# Patient Record
Sex: Female | Born: 1970
Health system: Southern US, Community
[De-identification: ages and names within clinical notes are randomized; demographics above are authoritative.]

## PROBLEM LIST (undated history)

## (undated) DIAGNOSIS — D649 Anemia, unspecified: Secondary | ICD-10-CM

## (undated) DIAGNOSIS — E079 Disorder of thyroid, unspecified: Secondary | ICD-10-CM

## (undated) DIAGNOSIS — IMO0001 Reserved for inherently not codable concepts without codable children: Secondary | ICD-10-CM

## (undated) DIAGNOSIS — E559 Vitamin D deficiency, unspecified: Secondary | ICD-10-CM

## (undated) DIAGNOSIS — B029 Zoster without complications: Secondary | ICD-10-CM

## (undated) DIAGNOSIS — T7840XA Allergy, unspecified, initial encounter: Secondary | ICD-10-CM

## (undated) HISTORY — DX: Reserved for inherently not codable concepts without codable children: IMO0001

## (undated) HISTORY — DX: Zoster without complications: B02.9

## (undated) HISTORY — DX: Allergy, unspecified, initial encounter: T78.40XA

## (undated) HISTORY — DX: Disorder of thyroid, unspecified: E07.9

## (undated) HISTORY — DX: Vitamin D deficiency, unspecified: E55.9

## (undated) HISTORY — DX: Anemia, unspecified: D64.9

---

## 2008-01-09 ENCOUNTER — Other Ambulatory Visit: Admission: RE | Admit: 2008-01-09 | Discharge: 2008-01-09 | Payer: Self-pay | Admitting: Internal Medicine

## 2008-01-09 ENCOUNTER — Ambulatory Visit: Payer: Self-pay | Admitting: Internal Medicine

## 2008-01-09 ENCOUNTER — Encounter: Payer: Self-pay | Admitting: Internal Medicine

## 2008-01-09 DIAGNOSIS — M549 Dorsalgia, unspecified: Secondary | ICD-10-CM | POA: Insufficient documentation

## 2008-01-13 LAB — CONVERTED CEMR LAB
ALT: 21 units/L (ref 0–35)
AST: 16 units/L (ref 0–37)
Albumin: 3.8 g/dL (ref 3.5–5.2)
Alkaline Phosphatase: 48 units/L (ref 39–117)
Basophils Relative: 3.2 % — ABNORMAL HIGH (ref 0.0–1.0)
Bilirubin, Direct: 0.1 mg/dL (ref 0.0–0.3)
CO2: 30 meq/L (ref 19–32)
Calcium: 9.2 mg/dL (ref 8.4–10.5)
Cholesterol: 142 mg/dL (ref 0–200)
GFR calc Af Amer: 122 mL/min
GFR calc non Af Amer: 101 mL/min
Lymphocytes Relative: 65.2 % — ABNORMAL HIGH (ref 12.0–46.0)
MCHC: 32.3 g/dL (ref 30.0–36.0)
Neutrophils Relative %: 19.9 % — ABNORMAL LOW (ref 43.0–77.0)
RBC: 4.58 M/uL (ref 3.87–5.11)
TSH: 3.5 microintl units/mL (ref 0.35–5.50)
Total Bilirubin: 0.4 mg/dL (ref 0.3–1.2)
Total Protein: 6.9 g/dL (ref 6.0–8.3)

## 2008-01-26 ENCOUNTER — Encounter: Admission: RE | Admit: 2008-01-26 | Discharge: 2008-01-26 | Payer: Self-pay | Admitting: Internal Medicine

## 2008-02-02 ENCOUNTER — Encounter (INDEPENDENT_AMBULATORY_CARE_PROVIDER_SITE_OTHER): Payer: Self-pay | Admitting: *Deleted

## 2008-02-15 ENCOUNTER — Encounter (INDEPENDENT_AMBULATORY_CARE_PROVIDER_SITE_OTHER): Payer: Self-pay | Admitting: *Deleted

## 2008-02-29 ENCOUNTER — Ambulatory Visit: Payer: Self-pay | Admitting: Internal Medicine

## 2008-02-29 DIAGNOSIS — D72819 Decreased white blood cell count, unspecified: Secondary | ICD-10-CM | POA: Insufficient documentation

## 2008-03-02 LAB — CONVERTED CEMR LAB
Basophils Relative: 4.3 % — ABNORMAL HIGH (ref 0.0–1.0)
Lymphocytes Relative: 55.7 % — ABNORMAL HIGH (ref 12.0–46.0)
MCV: 87.5 fL (ref 78.0–100.0)
Monocytes Absolute: 0.4 10*3/uL (ref 0.2–0.7)
Neutrophils Relative %: 20.4 % — ABNORMAL LOW (ref 43.0–77.0)
RBC: 4.45 M/uL (ref 3.87–5.11)
RDW: 12.6 % (ref 11.5–14.6)
WBC: 3.1 10*3/uL — ABNORMAL LOW (ref 4.5–10.5)

## 2008-06-20 ENCOUNTER — Encounter: Payer: Self-pay | Admitting: Internal Medicine

## 2008-08-28 ENCOUNTER — Telehealth (INDEPENDENT_AMBULATORY_CARE_PROVIDER_SITE_OTHER): Payer: Self-pay | Admitting: *Deleted

## 2008-08-30 ENCOUNTER — Encounter (INDEPENDENT_AMBULATORY_CARE_PROVIDER_SITE_OTHER): Payer: Self-pay | Admitting: *Deleted

## 2008-09-12 ENCOUNTER — Ambulatory Visit: Payer: Self-pay | Admitting: Internal Medicine

## 2008-09-14 LAB — CONVERTED CEMR LAB
Basophils Absolute: 0.1 10*3/uL (ref 0.0–0.1)
Eosinophils Absolute: 0.2 10*3/uL (ref 0.0–0.7)
HCT: 35.6 % — ABNORMAL LOW (ref 36.0–46.0)
Hemoglobin: 12.3 g/dL (ref 12.0–15.0)
Neutro Abs: 4.8 10*3/uL (ref 1.4–7.7)
Neutrophils Relative %: 68.1 % (ref 43.0–77.0)
RBC: 4.09 M/uL (ref 3.87–5.11)

## 2009-02-22 ENCOUNTER — Encounter: Admission: RE | Admit: 2009-02-22 | Discharge: 2009-02-22 | Payer: Self-pay | Admitting: Internal Medicine

## 2009-03-01 ENCOUNTER — Encounter (INDEPENDENT_AMBULATORY_CARE_PROVIDER_SITE_OTHER): Payer: Self-pay | Admitting: *Deleted

## 2009-03-22 ENCOUNTER — Encounter: Payer: Self-pay | Admitting: Internal Medicine

## 2009-03-22 ENCOUNTER — Ambulatory Visit: Payer: Self-pay | Admitting: Internal Medicine

## 2009-03-22 ENCOUNTER — Other Ambulatory Visit: Admission: RE | Admit: 2009-03-22 | Discharge: 2009-03-22 | Payer: Self-pay | Admitting: Internal Medicine

## 2009-03-22 DIAGNOSIS — J309 Allergic rhinitis, unspecified: Secondary | ICD-10-CM | POA: Insufficient documentation

## 2009-03-22 DIAGNOSIS — N926 Irregular menstruation, unspecified: Secondary | ICD-10-CM | POA: Insufficient documentation

## 2009-03-27 LAB — CONVERTED CEMR LAB
HCT: 39.3 % (ref 36.0–46.0)
HDL: 38.8 mg/dL — ABNORMAL LOW (ref >39.00)
Hemoglobin: 13.4 g/dL (ref 12.0–15.0)
Lymphs Abs: 2.3 10*3/uL (ref 0.7–4.0)
MCHC: 34 g/dL (ref 30.0–36.0)
Neutro Abs: 3.3 10*3/uL (ref 1.4–7.7)
Neutrophils Relative %: 52.2 % (ref 43.0–77.0)
Platelets: 231 10*3/uL (ref 150.0–400.0)
Total CHOL/HDL Ratio: 3
Triglycerides: 34 mg/dL (ref 0.0–149.0)
VLDL: 6.8 mg/dL (ref 0.0–40.0)
WBC: 6.2 10*3/uL (ref 4.5–10.5)

## 2009-04-26 ENCOUNTER — Telehealth: Payer: Self-pay | Admitting: Internal Medicine

## 2010-01-08 ENCOUNTER — Emergency Department (HOSPITAL_COMMUNITY): Admission: EM | Admit: 2010-01-08 | Discharge: 2010-01-08 | Payer: Self-pay | Admitting: Family Medicine

## 2010-02-28 ENCOUNTER — Encounter: Admission: RE | Admit: 2010-02-28 | Discharge: 2010-02-28 | Payer: Self-pay | Admitting: Internal Medicine

## 2010-10-23 ENCOUNTER — Telehealth (INDEPENDENT_AMBULATORY_CARE_PROVIDER_SITE_OTHER): Payer: Self-pay | Admitting: *Deleted

## 2011-01-13 NOTE — Progress Notes (Signed)
Summary: Appt  Phone Note Outgoing Call   Call placed by: Army Fossa CMA,  October 23, 2010 1:44 PM Summary of Call: Left message for pt in order to fill out form for [employer] pt needs to have an appt.   Follow-up for Phone Call        left message to call back.Marland KitchenMarland KitchenHarold Barban  October 24, 2010 9:14 AM  Additional Follow-up for Phone Call Additional follow up Details #1::        I spoke with pt she will call back and make an appt.  Additional Follow-up by: Army Fossa CMA,  October 27, 2010 3:22 PM

## 2011-01-23 ENCOUNTER — Other Ambulatory Visit: Payer: Self-pay | Admitting: Internal Medicine

## 2011-01-23 DIAGNOSIS — Z1231 Encounter for screening mammogram for malignant neoplasm of breast: Secondary | ICD-10-CM

## 2011-02-04 ENCOUNTER — Encounter: Payer: Self-pay | Admitting: Internal Medicine

## 2011-03-09 ENCOUNTER — Ambulatory Visit
Admission: RE | Admit: 2011-03-09 | Discharge: 2011-03-09 | Disposition: A | Discharge status: Home / Self Care | Payer: Self-pay | Source: Ambulatory Visit | Attending: Internal Medicine | Admitting: Internal Medicine

## 2011-03-09 ENCOUNTER — Other Ambulatory Visit (HOSPITAL_COMMUNITY)
Admission: RE | Admit: 2011-03-09 | Discharge: 2011-03-09 | Disposition: A | Discharge status: Home / Self Care | Payer: 59 | Source: Ambulatory Visit | Attending: Internal Medicine | Admitting: Internal Medicine

## 2011-03-09 ENCOUNTER — Ambulatory Visit (INDEPENDENT_AMBULATORY_CARE_PROVIDER_SITE_OTHER): Payer: 59 | Admitting: Internal Medicine

## 2011-03-09 ENCOUNTER — Other Ambulatory Visit: Payer: Self-pay | Admitting: Internal Medicine

## 2011-03-09 ENCOUNTER — Encounter: Payer: Self-pay | Admitting: Internal Medicine

## 2011-03-09 DIAGNOSIS — Z0000 Encounter for general adult medical examination without abnormal findings: Secondary | ICD-10-CM | POA: Insufficient documentation

## 2011-03-09 DIAGNOSIS — Z1231 Encounter for screening mammogram for malignant neoplasm of breast: Secondary | ICD-10-CM

## 2011-03-09 DIAGNOSIS — Z01419 Encounter for gynecological examination (general) (routine) without abnormal findings: Secondary | ICD-10-CM | POA: Insufficient documentation

## 2011-03-09 DIAGNOSIS — J309 Allergic rhinitis, unspecified: Secondary | ICD-10-CM

## 2011-03-09 MED ORDER — MOMETASONE FUROATE 50 MCG/ACT NA SUSP: 2.0000 | Freq: Every day | NASAL | Status: DC

## 2011-03-09 NOTE — Assessment & Plan Note (Signed)
On Zyrtec, continue with symptoms, see review of systems. Add Nasonex

## 2011-03-09 NOTE — Progress Notes (Signed)
  Subjective:    Patient ID: Natasha Wells, female    DOB: 05/06/1971, 40 y.o.   MRN: [id number]  HPI  complete physical exam   Review of Systems   in general doing well, from time to time feels fatigued.  also occasionally has pain at the base of the posterior neck and trapezoid areas bilaterally. Thinks is related to the posture that she keeps for several hours at work. No nausea, vomiting or diarrhea. No blood in the stools No anxiety or depression Periods are more regular than before, approximately once a month, last menstrual period  ~ 3 weeks ago. She does self breast exam from time to time, no abnormalities reported.  she continue with allergies, currently taking only Zyrtec, despite the medication she has a itchy nose and  soft palpate.  Past Medical History  Diagnosis Date  . Allergic rhinitis   . Birth control     condoms    Past Surgical History  Procedure Date  . Cesarean section     x 2    Family History  Problem Relation Age of Onset  . Lung cancer Brother   . Cancer Brother     lung cancer  . Leukemia      nephew  . Hypertension Neg Hx   . Stroke Neg Hx   . 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 Has gain wt        Objective:   Physical Exam  Constitutional: She appears well-developed and well-nourished.  Neck: Normal range of motion. Neck supple. No thyromegaly present.  Cardiovascular: Normal rate, regular rhythm and normal heart sounds.   Pulmonary/Chest: Effort normal and breath sounds normal. No respiratory distress. She has no wheezes. She has no rales.  Abdominal: She exhibits no distension and no mass. There is no tenderness. There is no rebound and no guarding.  Genitourinary: Vagina normal and uterus normal. No breast swelling, tenderness, discharge or bleeding. Cervix  exhibits no motion tenderness, no discharge and no friability. Right adnexum displays no mass and no tenderness. Left adnexum displays no mass and no tenderness. No erythema, tenderness or bleeding around the vagina. No signs of injury around the vagina. No vaginal discharge found.          Assessment & Plan:

## 2011-03-09 NOTE — Assessment & Plan Note (Addendum)
Td 03  negative Pap smear in 2010, also had a Pap smear done in [place] last year. Negative mammogram 3-11, reportedly had a mammogram this morning, report pending.  sending a Pap smear today Encourage self breast exam, healthy diet and exercise Return to the office one year

## 2011-03-10 ENCOUNTER — Encounter: Payer: Self-pay | Admitting: Internal Medicine

## 2011-03-10 LAB — LIPID PANEL
Cholesterol: 152 mg/dL (ref 0–200)
HDL: 49 mg/dL (ref >39.00)
LDL Cholesterol: 88 mg/dL (ref 0–99)
Total CHOL/HDL Ratio: 3
Triglycerides: 73 mg/dL (ref 0.0–149.0)

## 2011-03-10 LAB — BASIC METABOLIC PANEL
BUN: 15 mg/dL (ref 6–23)
CO2: 29 mEq/L (ref 19–32)
Calcium: 9.3 mg/dL (ref 8.4–10.5)
Creatinine, Ser: 0.7 mg/dL (ref 0.4–1.2)
GFR: 99.01 mL/min (ref >60.00)
Potassium: 5.3 mEq/L — ABNORMAL HIGH (ref 3.5–5.1)
Sodium: 142 mEq/L (ref 135–145)

## 2011-03-10 LAB — CBC WITH DIFFERENTIAL/PLATELET
Basophils Relative: 0.2 % (ref 0.0–3.0)
Eosinophils Relative: 7 % — ABNORMAL HIGH (ref 0.0–5.0)
HCT: 37.2 % (ref 36.0–46.0)
Lymphocytes Relative: 58.2 % — ABNORMAL HIGH (ref 12.0–46.0)
Lymphs Abs: 1.8 10*3/uL (ref 0.7–4.0)
Monocytes Absolute: 0.3 10*3/uL (ref 0.1–1.0)
Platelets: 216 10*3/uL (ref 150.0–400.0)
RBC: 4.22 Mil/uL (ref 3.87–5.11)

## 2011-03-10 LAB — TSH: TSH: 1.72 u[IU]/mL (ref 0.35–5.50)

## 2011-03-13 ENCOUNTER — Encounter: Payer: Self-pay | Admitting: *Deleted

## 2011-05-22 ENCOUNTER — Encounter: Payer: Self-pay | Admitting: Internal Medicine

## 2011-05-22 ENCOUNTER — Ambulatory Visit (INDEPENDENT_AMBULATORY_CARE_PROVIDER_SITE_OTHER): Payer: 59 | Admitting: Internal Medicine

## 2011-05-22 DIAGNOSIS — E875 Hyperkalemia: Secondary | ICD-10-CM

## 2011-05-22 DIAGNOSIS — M542 Cervicalgia: Secondary | ICD-10-CM

## 2011-05-22 MED ORDER — CYCLOBENZAPRINE HCL 10 MG PO TABS: ORAL_TABLET | ORAL | Status: DC

## 2011-05-22 MED ORDER — PREDNISONE 10 MG PO TABS: ORAL_TABLET | ORAL | Status: AC

## 2011-05-22 NOTE — Assessment & Plan Note (Signed)
Pain resembles a C6 radiculopathy Plan:  Steroids, tylenol, flexeril Further w/u if no better in 3 weeks

## 2011-05-22 NOTE — Progress Notes (Signed)
  Subjective:    Patient ID: Natasha Wells, female    DOB: 1971/07/27, 40 y.o.   MRN: [id number]  HPI Three-week history of pain from the base of the neck, right side, radiates down to the shoulder, arm and hand, mostly on the radial side of the hand . Pain worse with shoulder or neck motion. Over-the-counter medicines helping little. In the last few days, the pain is also going up to the nuchal area. Needs her potassium rechecked  Past Medical History  Diagnosis Date  . Allergic rhinitis   . Birth control     condoms   Past Surgical History  Procedure Date  . Cesarean section     x 2     Review of Systems No recent neck injury, no headache per se, no rash anywhere. 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 No bladder or bowel incontinence Neuro ROS neg except for mild difficulty with right grip?    Objective:   Physical Exam Alert, oriented, no apparent distress. Neck full range of motion although patient complains about discomfort when she turns the head to the right. Neurological exam: Reflexes are normal except for decreased bilateral ankle jerk, motor strength normal. Gait normal. Muscular exam: Not tender to palpation at the neck or trapezoid area, shoulder range of motion normal bilaterally.       Assessment & Plan:

## 2011-05-22 NOTE — Assessment & Plan Note (Signed)
recheck

## 2011-05-22 NOTE — Patient Instructions (Addendum)
Prednisone as prescribed x few days Flexeril at bedtime x 10 days Tylenol 500mg  1 or 2 tabs every 6 hours as neded for pain Call if no better in 2-3 weeks

## 2011-05-25 ENCOUNTER — Telehealth: Payer: Self-pay | Admitting: *Deleted

## 2011-05-25 NOTE — Telephone Encounter (Signed)
Message copied by Leanne Lovely on Mon May 25, 2011 11:08 AM ------      Message from: [name]      Created: Sun May 24, 2011  1:15 PM       Advise patient:      Natasha Wells is now ok

## 2011-05-25 NOTE — Telephone Encounter (Signed)
Pt is aware.  

## 2011-05-25 NOTE — Telephone Encounter (Signed)
Message left for patient to return my call.  

## 2012-01-15 ENCOUNTER — Encounter: Payer: Self-pay | Admitting: Gynecology

## 2012-01-15 ENCOUNTER — Other Ambulatory Visit: Payer: Self-pay | Admitting: Gynecology

## 2012-01-15 ENCOUNTER — Ambulatory Visit (INDEPENDENT_AMBULATORY_CARE_PROVIDER_SITE_OTHER): Payer: 59 | Admitting: Gynecology

## 2012-01-15 VITALS — BP 124/80 | Ht 59.5 in | Wt 159.0 lb

## 2012-01-15 DIAGNOSIS — R300 Dysuria: Secondary | ICD-10-CM

## 2012-01-15 DIAGNOSIS — R221 Localized swelling, mass and lump, neck: Secondary | ICD-10-CM | POA: Insufficient documentation

## 2012-01-15 DIAGNOSIS — R220 Localized swelling, mass and lump, head: Secondary | ICD-10-CM

## 2012-01-15 DIAGNOSIS — M25519 Pain in unspecified shoulder: Secondary | ICD-10-CM

## 2012-01-15 DIAGNOSIS — N390 Urinary tract infection, site not specified: Secondary | ICD-10-CM

## 2012-01-15 LAB — CBC WITH DIFFERENTIAL/PLATELET
Basophils Absolute: 0 10*3/uL (ref 0.0–0.1)
Eosinophils Absolute: 0.1 10*3/uL (ref 0.0–0.7)
HCT: 41.9 % (ref 36.0–46.0)
Lymphocytes Relative: 31 % (ref 12–46)
Lymphs Abs: 2.2 10*3/uL (ref 0.7–4.0)
MCH: 28.8 pg (ref 26.0–34.0)
MCHC: 32.7 g/dL (ref 30.0–36.0)
Monocytes Relative: 8 % (ref 3–12)
Neutrophils Relative %: 60 % (ref 43–77)
Platelets: 257 10*3/uL (ref 150–400)
RDW: 13.6 % (ref 11.5–15.5)
WBC: 7.1 10*3/uL (ref 4.0–10.5)

## 2012-01-15 LAB — URINALYSIS W MICROSCOPIC + REFLEX CULTURE
Bilirubin Urine: NEGATIVE
Crystals: NONE SEEN
Glucose, UA: NEGATIVE mg/dL
Protein, ur: 30 mg/dL — AB
Specific Gravity, Urine: 1.025 (ref 1.005–1.030)
Urobilinogen, UA: 0.2 mg/dL (ref 0.0–1.0)

## 2012-01-15 MED ORDER — NITROFURANTOIN MONOHYD MACRO 100 MG PO CAPS: 100.0000 mg | ORAL_CAPSULE | Freq: Two times a day (BID) | ORAL | Status: AC

## 2012-01-15 NOTE — Patient Instructions (Signed)
Infeccin del tracto urinario (Urinary Tract Infection) Las infecciones en el tracto urinario pueden comenzar en varios lugares. Una infeccin en la vejiga (cistitis), una infeccin en el rin (pielonefritis) o una infeccin en la prstata (prostatitis) son diferentes tipos de infeccin del tracto urinario. Por lo general mejoran si se los trata con antibiticos. Los antibiticos son medicamentos que matan grmenes. [employer] medicamentos que le han recetado hasta que se terminen. Podr sentirse bien dentro de [address], pero DEBE TOMAR LOS MEDICAMENTOS HASTA TERMINAR EL TRATAMIENTO, de lo contrario la infeccin puede no solucionarse y luego ser ms difcil de Warehouse manager. INSTRUCCIONES PARA EL CUIDADO DOMICILIARIO  Beba gran cantidad de lquidos para mantener la orina de tono claro o color amarillo plido. Se recomienda especialmente el jugo de arndanos rojos, adems de grandes cantidades de [place].   Evite la cafena, el t y las bebidas con gas. Estas sustancias irritan la vejiga.   El alcohol puede Teacher, adult education.   Utilice los medicamentos de venta libre o de prescripcin para Chief Technology Officer, Environmental health practitioner o la [place], segn se lo indique el profesional que lo asiste.  PARA PREVENIR FUTURAS INFECCIONES:  Vace la vejiga con frecuencia. Evite retener la orina durante largos perodos.   Despus de mover el intestino, las mujeres deben higienizarse la regin perineal desde adelante hacia atrs. Use cada papel tissue slo una vez.   Vace la vejiga antes y despus de Management consultant.  OBTENER LOS RESULTADOS DE LAS PRUEBAS Durante su visita no contar con todos los [employer]. En este caso, tenga otra entrevista con su mdico para conocerlos. No piense que el resultado es normal si no tiene noticias de su mdico o de la institucin mdica. Es Copy seguimiento de todos los [place].  SOLICITE ATENCIN MDICA SI:  Siente dolor en la espalda.    El beb tiene ms de 3 meses y su temperatura rectal es de 100.5 F (38.1 C) o ms durante ms de 1 da.   Los problemas (sntomas) no mejoran en 3 das. Solicite atencin mdica antes si empeora.  SOLICITE ATENCIN MDICA DE INMEDIATO SI:  Comienza a sentir un dolor de espaldas o en la zona abdominal inferior intenso.   Comienza a sentir escalofros.   Tiene fiebre.   Su beb tiene ms de 3 meses y su temperatura rectal es de 102 F (38.9 C) o mayor.   Su beb tiene 3 meses o menos y su temperatura rectal es de 100.4 F (38 C) o mayor.   Siente nuseas o vmitos.   Tiene una sensacin continua de quemazn o molestias al [employer].  EST SEGURO QUE:  Comprende las instrucciones para el alta mdica.   Controlar su enfermedad.   Solicitar atencin mdica de inmediato segn las indicaciones.  Document Released: 09/09/2005 Document Revised: 08/12/2011 [facility] Patient Information 2012 [place], [place].

## 2012-01-15 NOTE — Progress Notes (Signed)
Patient is a 41 year old new patient to the practice who is been concern today are 2 issues:  A. Dysuria and frequency over the past week. Patient denied any back pain no fever chills nausea or vomiting. She states her dysuria has improved but she still voiding frequently and appears that she is not emptying completely.  B. Right-sided neck mass that she noticed partially one month ago. She states that she has right-sided neck discomfort and shoulder into the back of her head.  Patient stated her last gynecological examination was June of 2012. Menstrual cycles reported to be normal in using condoms for contraception.  Exam: HEENT: Right submandibular nodularity firm and tender. No oropharyngeal lesions ear canal clear good light reflex from TM. Abdomen: Soft nontender no rebound or guarding Back: No CVA tenderness Pelvic: Bartholin urethra Skene was within normal limits Vagina: No lesions or discharge Cervix: No lesions or discharge Uterus: Anteverted normal size shape and consistency Adnexa: No palpable masses or tenderness Rectal: Not examined   Urinalysis 7-10 WBC, 11-20 rbc's, many bacteria.  Assessment/plan: Urinary tract infection. We'll treat with Macrobid 100 mg twice a day for 7 days. She'll be placed on Uribell anti-spasmodic agent one by mouth 4 times a day for 2 days. A TSH and CBC will be ordered today. Soft tissue ultrasound of head and neck ordered with referral to an ENT one week after. Patient to see me in June for her annual gynecological examination.

## 2012-01-15 NOTE — Progress Notes (Signed)
Addended by: Ok Edwards on: 01/15/2012 02:10 PM   Modules accepted: Orders

## 2012-01-18 ENCOUNTER — Other Ambulatory Visit: Payer: 59

## 2012-01-18 ENCOUNTER — Other Ambulatory Visit: Payer: Self-pay | Admitting: *Deleted

## 2012-01-18 ENCOUNTER — Other Ambulatory Visit: Payer: Self-pay | Admitting: Gynecology

## 2012-01-18 ENCOUNTER — Telehealth: Payer: Self-pay | Admitting: *Deleted

## 2012-01-18 DIAGNOSIS — R221 Localized swelling, mass and lump, neck: Secondary | ICD-10-CM

## 2012-01-18 LAB — URINE CULTURE

## 2012-01-18 NOTE — Telephone Encounter (Signed)
Patient informed appt set for u/s at [facility] on 01/20/12 @ 10:15am and appt set with Dr. Ezzard Standing on 02/01/12 @ 1:45pm.  Records will be faxed along with u/s report.

## 2012-01-18 NOTE — Telephone Encounter (Signed)
Message copied by Libby Maw on Mon Jan 18, 2012 10:37 AM ------      Message from: Ok Edwards      Created: Mon Jan 18, 2012  6:47 AM       Berthel Bagnall,I was reviewing charts and wanted to make sure that you   got the request on this patient for a soft tissue ultrasound of a right sided neck mass and follow up consult with ENT 1 week after scan. I also had put in and asked Elane Fritz to tell patient to come in for a Vitamin D, Ca, and PTH . Let me know. Thanks

## 2012-01-19 ENCOUNTER — Other Ambulatory Visit: Payer: Self-pay | Admitting: Gynecology

## 2012-01-20 ENCOUNTER — Other Ambulatory Visit: Payer: Self-pay | Admitting: *Deleted

## 2012-01-20 ENCOUNTER — Ambulatory Visit (HOSPITAL_COMMUNITY)
Admission: RE | Admit: 2012-01-20 | Discharge: 2012-01-20 | Disposition: A | Discharge status: Home / Self Care | Payer: 59 | Source: Ambulatory Visit | Attending: Gynecology | Admitting: Gynecology

## 2012-01-20 DIAGNOSIS — R221 Localized swelling, mass and lump, neck: Secondary | ICD-10-CM

## 2012-01-20 DIAGNOSIS — R220 Localized swelling, mass and lump, head: Secondary | ICD-10-CM | POA: Insufficient documentation

## 2012-01-20 DIAGNOSIS — R300 Dysuria: Secondary | ICD-10-CM

## 2012-01-20 MED ORDER — ERGOCALCIFEROL 1.25 MG (50000 UT) PO CAPS: 50000.0000 [IU] | ORAL_CAPSULE | ORAL | Status: DC

## 2012-03-04 ENCOUNTER — Other Ambulatory Visit: Payer: Self-pay | Admitting: Internal Medicine

## 2012-03-04 ENCOUNTER — Other Ambulatory Visit: Payer: Self-pay | Admitting: Gynecology

## 2012-03-04 DIAGNOSIS — Z1231 Encounter for screening mammogram for malignant neoplasm of breast: Secondary | ICD-10-CM

## 2012-03-14 ENCOUNTER — Ambulatory Visit
Admission: RE | Admit: 2012-03-14 | Discharge: 2012-03-14 | Disposition: A | Discharge status: Home / Self Care | Payer: 59 | Source: Ambulatory Visit | Attending: Gynecology | Admitting: Gynecology

## 2012-03-14 DIAGNOSIS — Z1231 Encounter for screening mammogram for malignant neoplasm of breast: Secondary | ICD-10-CM

## 2012-04-25 ENCOUNTER — Other Ambulatory Visit: Payer: 59

## 2012-04-26 ENCOUNTER — Other Ambulatory Visit: Payer: Self-pay

## 2012-04-26 MED ORDER — ERGOCALCIFEROL 1.25 MG (50000 UT) PO CAPS: 50000.0000 [IU] | ORAL_CAPSULE | ORAL | Status: DC

## 2012-04-27 LAB — VITAMIN D 25 HYDROXY (VIT D DEFICIENCY, FRACTURES): Vit D, 25-Hydroxy: 38 ng/mL (ref 30–89)

## 2012-08-16 ENCOUNTER — Ambulatory Visit (INDEPENDENT_AMBULATORY_CARE_PROVIDER_SITE_OTHER): Payer: 59 | Admitting: Gynecology

## 2012-08-16 ENCOUNTER — Encounter: Payer: Self-pay | Admitting: Gynecology

## 2012-08-16 VITALS — BP 124/72 | Ht 59.75 in | Wt 163.0 lb

## 2012-08-16 DIAGNOSIS — Z01419 Encounter for gynecological examination (general) (routine) without abnormal findings: Secondary | ICD-10-CM

## 2012-08-16 DIAGNOSIS — R635 Abnormal weight gain: Secondary | ICD-10-CM

## 2012-08-16 DIAGNOSIS — G43829 Menstrual migraine, not intractable, without status migrainosus: Secondary | ICD-10-CM

## 2012-08-16 DIAGNOSIS — E559 Vitamin D deficiency, unspecified: Secondary | ICD-10-CM

## 2012-08-16 LAB — CBC WITH DIFFERENTIAL/PLATELET
Basophils Absolute: 0 10*3/uL (ref 0.0–0.1)
Basophils Relative: 0 % (ref 0–1)
Hemoglobin: 13.6 g/dL (ref 12.0–15.0)
MCHC: 32.5 g/dL (ref 30.0–36.0)
Monocytes Relative: 6 % (ref 3–12)
Neutro Abs: 3.1 10*3/uL (ref 1.7–7.7)
Neutrophils Relative %: 58 % (ref 43–77)
Platelets: 255 10*3/uL (ref 150–400)
RBC: 4.83 MIL/uL (ref 3.87–5.11)

## 2012-08-16 LAB — GLUCOSE, RANDOM: Glucose, Bld: 84 mg/dL (ref 70–99)

## 2012-08-16 LAB — LIPID PANEL
HDL: 54 mg/dL (ref >39)
LDL Cholesterol: 81 mg/dL (ref 0–99)
Total CHOL/HDL Ratio: 2.8 Ratio
VLDL: 16 mg/dL (ref 0–40)

## 2012-08-16 LAB — TSH: TSH: 4.117 u[IU]/mL (ref 0.350–4.500)

## 2012-08-16 MED ORDER — ESTRADIOL 0.025 MG/24HR TD PTTW: MEDICATED_PATCH | TRANSDERMAL | Status: DC

## 2012-08-16 NOTE — Progress Notes (Signed)
Natasha Wells Jul 17, 1971 [id number]   History:    41 y.o.  for annual gyn exam with only complaint today was weight gain. She stated she had a gynecological examination and Pap smear in 2012 which was normal. She is using condoms for contraception. Early this year she was diagnosed with vitamin D deficiency. Patient had a mammogram this year which was reportedly normal and she frequently does her self breast examination. She is using condoms for contraception has had 2 C-sections in the past. She also brought to my attention to the past 2 years she has suffered from premenstrual migraines for 2-3 days prior to start of her menses.  Past medical history,surgical history, family history and social history were all reviewed and documented in the EPIC chart.  Gynecologic History Patient's last menstrual period was 07/16/2012. Contraception: condoms Last Pap: 2012. Results were: normal Last mammogram: 2013. Results were: normal  Obstetric History OB History    Grav Para Term Preterm Abortions TAB SAB Ect Mult Living   2 2 2       2      # Outc Date GA Lbr Len/2nd Wgt Sex Del Anes PTL Lv   1 TRM     M CS  No Yes   2 TRM     F CS  No Yes       ROS: A ROS was performed and pertinent positives and negatives are included in the history.  GENERAL: No fevers or chills. HEENT: No change in vision, no earache, sore throat or sinus congestion. NECK: No pain or stiffness. CARDIOVASCULAR: No chest pain or pressure. No palpitations. PULMONARY: No shortness of breath, cough or wheeze. GASTROINTESTINAL: No abdominal pain, nausea, vomiting or diarrhea, melena or bright red blood per rectum. GENITOURINARY: No urinary frequency, urgency, hesitancy or dysuria. MUSCULOSKELETAL: No joint or muscle pain, no back pain, no recent trauma. DERMATOLOGIC: No rash, no itching, no lesions. ENDOCRINE: No polyuria, polydipsia, no heat or cold intolerance. No recent change in weight. HEMATOLOGICAL: No anemia or easy bruising or  bleeding. NEUROLOGIC: No headache, seizures, numbness, tingling or weakness. PSYCHIATRIC: No depression, no loss of interest in normal activity or change in sleep pattern.     Exam: chaperone present  BP 124/72  Ht 4' 11.75" (1.518 m)  Wt 163 lb (73.936 kg)  BMI 32.10 kg/m2  LMP 07/16/2012  Body mass index is 32.10 kg/(m^2).  General appearance : Well developed well nourished female. No acute distress HEENT: Neck supple, trachea midline, no carotid bruits, no thyroidmegaly Lungs: Clear to auscultation, no rhonchi or wheezes, or rib retractions  Heart: Regular rate and rhythm, no murmurs or gallops Breast:Examined in sitting and supine position were symmetrical in appearance, no palpable masses or tenderness,  no skin retraction, no nipple inversion, no nipple discharge, no skin discoloration, no axillary or supraclavicular lymphadenopathy Abdomen: no palpable masses or tenderness, no rebound or guarding Extremities: no edema or skin discoloration or tenderness  Pelvic:  Bartholin, Urethra, Skene Glands: Within normal limits             Vagina: No gross lesions or discharge  Cervix: No gross lesions or discharge  Uterus  anteverted, normal size, shape and consistency, non-tender and mobile  Adnexa  Without masses or tenderness  Anus and perineum  normal   Rectovaginal  normal sphincter tone without palpated masses or tenderness             Hemoccult not done     Assessment/Plan:  41 y.o. female  for annual exam with history of menstrual migraines and weight gain. We will offer her to use a transdermal estrogen patch such as Vivelle-Dot 0.025 to apply once a month 3-4 days before her menses. Its risks benefits and pros and cons were discussed. Literature information on exercise and diet was provided in [place]. The following labs will be drawn today: Calcium, vitamin D, PT H., fasting blood sugar, fasting lipid profile, TSH and urinalysis. No Pap smear done today new guidelines  discussed today. We discussed importance of calcium vitamin D for osteoporosis prevention as well as regular exercise.   Ok Edwards MD, 10:20 AM 08/16/2012

## 2012-08-16 NOTE — Patient Instructions (Addendum)
Ejercicios para perder peso (Exercise to Lose Weight) La actividad fsica y una dieta saludable ayudan a perder peso. El mdico podr sugerirle ejercicios especficos. IDEAS Y CONSEJOS PARA HACER EJERCICIOS  Elija opciones econmicas que disfrute hacer , como caminar, andar en bicicleta o los vdeos para ejercitarse.   Utilice las escaleras en lugar del ascensor.   Camine durante la hora del almuerzo.   Estacione el auto lejos del lugar de trabajo o estudio.   Concurra a un gimnasio o tome clases de gimnasia.   Comience con 5  10 minutos de actividad fsica por da. Ejercite hasta 30 minutos, 4 a 6 das por semana.   Utilice zapatos que tengan un buen soporte y ropas cmodas.   Elongue antes y despus de ejercitar.   Ejercite hasta que aumente la respiracin y el corazn palpite rpido.   Beba agua extra cuando ejercite.   No haga ejercicio hasta lastimarse, sentirse mareado o que le falte mucho el aire.  La actividad fsica puede quemar alrededor de 150 caloras.  Correr 20 cuadras en 15 minutos.   Jugar vley durante 45 a 60 minutos.   Limpiar y encerar el auto durante 45 a 60 minutos.   Jugar ftbol americano de toque.   Caminar 25 cuadras en 35 minutos.   Empujar un cochecito 20 cuadras en 30 minutos.   Jugar baloncesto durante 30 minutos.   Rastrillar hojas secas durante 30 minutos.   Andar en bicicleta 80 cuadras en 30 minutos.   Caminar 30 cuadras en 30 minutos.   Bailar durante 30 minutos.   Quitar la nieve con una pala durante 15 minutos.   Nadar vigorosamente durante 20 minutos.   Subir escaleras durante 15 minutos.   Andar en bicicleta 60 cuadras durante 15 minutos.   Arreglar el jardn entre 30 y 45 minutos.   Saltar a la soga durante 15 minutos.   Limpiar vidrios o pisos durante 45 a 60 minutos.  Document Released: 03/06/2011 Document Revised: 08/12/2011 ExitCare Patient Information 2012 [employer].                                                   Control del colesterol  Los niveles de colesterol en el organismo estn determinados significativamente por su dieta. Los niveles de colesterol tambin se relacionan con la enfermedad cardaca. El material que sigue ayuda a explicar esta relacin y a analizar qu puede hacer para mantener su corazn sano. No todo el colesterol es malo. Las lipoprotenas de baja densidad (LDL) forman el colesterol "malo". El colesterol malo puede ocasionar depsitos de grasa que se acumulan en el interior de las arterias. Las lipoprotenas de alta densidad (HDL) es el colesterol "bueno". Ayuda a remover el colesterol LDL "malo" de la sangre. El colesterol es un factor de riesgo muy importante para la enfermedad cardaca. Otros factores de riesgo son la hipertensin arterial, el hbito de fumar, el estrs, la herencia y el peso.   El msculo cardaco obtiene el suministro de sangre a travs de las arterias coronarias. Si su colesterol LDL ("malo") est elevado y el HDL ("bueno") es bajo, tiene un factor de riesgo para que se formen depsitos de grasa en las arterias coronarias (los vasos sanguneos que suministran sangre al corazn). Esto hace que haya menos lugar para que la sangre circule. Sin la suficiente sangre y   oxgeno, el msculo cardaco no puede funcionar correctamente, y usted podr sentir dolores en el pecho (angina pectoris). Cuando una arteria coronaria se cierra completamente, una parte del msculo cardaco puede morir (infarto de miocardio).  CONTROL DEL COLESTEROL Cuando el profesional que lo asiste enva la sangre al laboratorio para conocer el nivel de colesterol, puede realizarle tambin un perfil completo de los lpidos. Con esta prueba, se puede determinar la cantidad total de colesterol, as como los niveles de LDL y HDL. Los triglicridos son un tipo de grasa que circula en la sangre y que tambin puede utilizarse para determinar el riesgo de enfermedad cardaca. En la siguiente tabla se  establecen los nmeros ideales: Prueba: Colesterol total  Menos de 200 mg/dl.  Prueba: LDL "colesterol malo"  Menos de 100 mg/dl.   Menos de 70 mg/dl si tiene riesgo muy elevado de sufrir un ataque cardaco o muerte cardaca sbita.  Prueba: HDL "colesterol bueno"  Mujeres: Ms de 50 mg/dl.   Hombres: Ms de 40 mg/dl.  Prueba: Trigliceridos  Menos de 150 mg/dl.    CONTROL DEL COLESTEROL CON DIETA Aunque factores como el ejercicio y el estilo de vida son importantes, la "primera lnea de ataque" es la dieta. Esto se debe a que se sabe que ciertos alimentos hacen subir el colesterol y otros lo bajan. El objetivo debe ser equilibrar los alimentos, de modo que tengan un efecto sobre el colesterol y, an ms importante, reemplazar las grasas saturadas y trans con otros tipos de grasas, como las monoinsaturadas y las poliinsaturadas y cidos grasos omega-3 . En promedio, una persona no debe consumir ms de 15 a 17 g de grasas saturadas por da. Las grasas saturadas y trans se consideran grasas "malas", ya que elevan el colesterol LDL. Las grasas saturadas se encuentran principalmente en productos animales como carne, manteca y crema. Pero esto no significa que usted debe sacrificar todas sus comidas favoritas. Actualmente, como lo muestra el cuadro que figura al final de este documento, hay sustitutos de buen sabor, bajos en grasas y en colesterol, para la mayora de los alimentos que a usted le gusta comer. Elija aquellos alimentos alternativos que sean bajos en grasas o sin grasas. Elija cortes de carne del cuarto trasero o lomo ya que estos cortes son los que tienen menor cantidad de grasa y colesterol. El pollo (sin piel), el pescado, la carne de ternera, y la pechuga de pavo molida son excelentes opciones. Elimine las carnes grasosas como los hotdogs o el salami. Los mariscos tienen poco o nada de grasas saturadas. Cuando consuma carne magra, carne de aves de corral, o pescado, hgalo en  porciones de 85 gramos (3 onzas). Las grasas trans tambin se llaman "aceites parcialmente hidrogenados". Son aceites manipulados cientficamente de modo que son slidos a temperatura ambiente, tienen una larga vida y mejoran el sabor y la textura de los alimentos a los que se agregan. Las grasas trans se encuentran en la margarina, masitas, crackers y alimentos horneados.  Para hornear y cocinar, el aceite es un excelente sustituto para la mantequilla. Los aceites monoinsaturados tienen un beneficio particular, ya que se cree que disminuyen el colesterol LDL (colesterol malo) y elevan el HDL. Deber evitar los aceites tropicales saturados como el de coco y el de palma.  Recuerde, adems, que puede comer sin restricciones los grupos de alimentos que son naturalmente libres de grasas saturadas y grasas trans, entre los que se incluyen el pescado, las frutas (excepto el aguacate), verduras, frijoles, cereales (cebada, arroz,   cuzcuz, trigo) y las pastas (sin salsas con crema)   IDENTIFIQUE LOS ALIMENTOS QUE DISMINUYEN EL COLESTEROL  Pueden disminuir el colesterol las fibras solubles que estn en las frutas, como las manzanas, en los vegetales como el brcoli, las patatas y las zanahorias; en las legumbres como frijoles, guisantes y lentejas; y en los cereales como la cebada. Los alimentos fortificados con fitosteroles tambin pueden disminuir el colesterol. Debe consumir al menos 2 g de estos alimentos a diario para obtener el efecto de disminucin de colesterol.  En el supermercado, lea las etiquetas de los envases para identificar los alimentos bajos en grasas saturadas, libres de grasas trans y bajos en grasas, . Elija quesos que tengan solo de 2 a 3 g de grasa saturada por onza (28,35 g). Use una margarina que no dae el corazn, libre de grasas trans o aceite parcialmente hidrogenado. Al comprar alimentos horneados (galletitas dulces y galletas) evite el aceite parcialmente hidrogenado. Los panes y bollos  debern ser de granos enteros (harina de maz o de avena entera, en lugar de "harina" o "harina enriquecida"). Compre sopas en lata que no sean cremosas, con bajo contenido de sal y sin grasas adicionadas.   TCNICAS DE PREPARACIN DE LOS ALIMENTOS  Nunca fra los alimentos en aceite abundante. Si debe frer, hgalo en poco aceite y removiendo constantemente, porque as se utilizan muy pocas grasas, o utilice un spray antiadherente. Cuando le sea posible, hierva, hornee o ase las carnes y cocine los vegetales al vapor. En vez de aderezar los vegetales con mantequilla o margarina, utilice limn y hierbas, pur de manzanas y canela (para las calabazas y batatas), yogurt y salsa descremados y aderezos para ensaladas bajos en contenido graso.   BAJO EN GRASAS SATURADAS / SUSTITUTOS BAJOS EN GRASA  Carnes / Grasas saturadas (g)  Evite: Bife, corte graso (3 oz/85 g) / 11 g   Elija: Bife, corte magro (3 oz/85 g) / 4 g   Evite: Hamburguesa (3 oz/85 g) / 7 g   Elija:  [place] magra (3 oz/85 g) / 5 g   Evite: Jamn (3 oz/85 g) / 6 g   Elija:  Jamn magro (3 oz/85 g) / 2.4 g   Evite: Pollo, con piel (3 oz/85 g), Carne oscura / 4 g   Elija:  Pollo, sin piel (3 oz/85 g), Carne oscura / 2 g   Evite: Pollo, con piel (3 oz/85 g), Carne magra / 2.5 g   Elija: Pollo, sin piel (3 oz/85 g), Carne magra / 1 g  Lcteos / Grasas saturadas (g)  Evite: Leche entera (1 taza) / 5 g   Elija: Leche con bajo contenido de grasa, 2% (1 taza) / 3 g   Elija: Leche con bajo contenido de grasa, 1% (1 taza) / 1.5 g   Elija: Leche descremada (1 taza) / 0.3 g   Evite: Queso duro (1 oz/28 g) / 6 g   Elija: Queso descremado (1 oz/28 g) / 2-3 g   Evite: Queso cottage, 4% grasa (1 taza)/ 6.5 g   Elija: Queso cottage con bajo contenido de grasa, 1% grasa (1 taza)/ 1.5 g   Evite: Helado (1 taza) / 9 g   Elija: Sorbete (1 taza) / 2.5 g   Elija: Yogurt helado sin contenido de grasa (1 taza) / 0.3 g   Elija:  Barras de fruta congeladas / vestigios   Evite: Crema batida (1 cucharada) / 3.5 g   Elija: Batidos glac sin lcteos (1 cucharada) /   1 g  Condimentos / Grasas saturadas (g)  Evite: Mayonesa (1 cucharada) / 2 g   Elija: Mayonesa con bajo contenido de grasa (1 cucharada) / 1 g   Evite: Manteca (1 cucharada) / 7 g   Elija: Margarina extra light (1 cucharada) / 1 g   Evite: Aceite de coco (1 cucharada) / 11.8 g   Elija: Aceite de oliva (1 cucharada) / 1.8 g   Elija: Aceite de maz (1 cucharada) / 1.7 g   Elija: Aceite de crtamo (1 cucharada) / 1.2 g   Elija: Aceite de girasol (1 cucharada) / 1.4 g   Elija: Aceite de soja (1 cucharada) / 2.4 g   Elija: Aceite de canola (1 cucharada) / 1 g  Document Released: 11/30/2005 Document Revised: 08/12/2011 ExitCare Patient Information 2012 [employer].  

## 2012-08-17 LAB — PTH, INTACT AND CALCIUM: PTH: 116.1 pg/mL — ABNORMAL HIGH (ref 14.0–72.0)

## 2012-08-18 ENCOUNTER — Other Ambulatory Visit: Payer: Self-pay | Admitting: Gynecology

## 2012-08-18 DIAGNOSIS — E559 Vitamin D deficiency, unspecified: Secondary | ICD-10-CM

## 2012-08-18 DIAGNOSIS — E215 Disorder of parathyroid gland, unspecified: Secondary | ICD-10-CM

## 2012-08-18 LAB — URINE CULTURE
Colony Count: NO GROWTH
Organism ID, Bacteria: NO GROWTH

## 2012-08-23 ENCOUNTER — Other Ambulatory Visit: Payer: 59

## 2012-08-23 DIAGNOSIS — E215 Disorder of parathyroid gland, unspecified: Secondary | ICD-10-CM

## 2012-08-23 DIAGNOSIS — E559 Vitamin D deficiency, unspecified: Secondary | ICD-10-CM

## 2012-08-29 ENCOUNTER — Encounter: Payer: Self-pay | Admitting: Gynecology

## 2012-08-29 ENCOUNTER — Ambulatory Visit (INDEPENDENT_AMBULATORY_CARE_PROVIDER_SITE_OTHER): Payer: 59 | Admitting: Gynecology

## 2012-08-29 DIAGNOSIS — E213 Hyperparathyroidism, unspecified: Secondary | ICD-10-CM | POA: Insufficient documentation

## 2012-08-29 NOTE — Progress Notes (Signed)
Patient is a 41 year old who was seen in the office on 08/16/2012 for her annual exam who is only complaint was weight gain. She presented to the office today for discussion of her persistently elevated PTH level despite normal vitamin D and calcium level. Review of her record indicated that in February this year she was seen for the first time in our practice and a complaint of a right sided neck mass in neck and shoulder and back of head discomfort. On examination she was found to have a right submandibular nodularity firm and tender. There was no oropharyngeal lesions or irritation and abnormality noted. She was sent for a soft tissue ultrasound of the head and neck with a referral to the ENT for further assessment. Findings from the ultrasound as follows:  Findings:  Right thyroid lobe: Measures 4.8 x 1.5 x 0.9 cm.  Left thyroid lobe: Measures 4.1 x 1.1 x 1.0 cm.  Isthmus: Measures 0.2 cm.  Focal nodules: None  Lymphadenopathy: The patient has a submandibular lymph node on the  right measuring 1.5 cm. The node has a prominent fatty hilum.  IMPRESSION:  1. Normal appearing thyroid gland.  2. Benign right submandibular lymph node.  She was referred to Dr. Ovidio Kin ENT who saw patient on 02/01/2012 whose assessment was "suspected partially  obstructed right submandibular gland or possible calcification within the right submandibular gland". He placed on Keflex and she was instructed to return of her symptoms have not resolved. She has no longer complained of neck shoulder and back of neck discomfort.  In February this year when her vitamin D level was found to be low at 18 with a normal calcium level of 9.0 and PTH slightly elevated at 84.4. She was placed on vitamin D. 50,000 units q. weekly for 12 weeks and then she returned on 04/25/2012 for followup vitamin D level which was back to normal with a value of 38. She was instructed and to stay on vitamin D 3 2000 units daily. Of note on February  of this year her TSH was normal as was her CBC.  Patient returned September 3 of this year for her annual gynecological examination and the following were her results:  Results for Natasha Wells, Natasha Wells (MRN [id number]) as of 08/29/2012 17:11  Ref. Range 08/16/2012 10:00  Glucose Latest Range: 70-99 mg/dL 84  Calcium, Total (PTH) Latest Range: 8.4-10.5 mg/dL 9.3  Cholesterol Latest Range: 0-200 mg/dL 409  Triglycerides Latest Range: <150 mg/dL 81  HDL Latest Range: >81 mg/dL 54  LDL (calc) Latest Range: 0-99 mg/dL 81  VLDL Latest Range: 0-40 mg/dL 16  Total CHOL/HDL Ratio No range found 2.8  Vit D, 25-Hydroxy Latest Range: 30-89 ng/mL 28 (L)  WBC Latest Range: 4.0-10.5 K/uL 5.4  RBC Latest Range: 3.87-5.11 MIL/uL 4.83  Hemoglobin Latest Range: 12.0-15.0 g/dL 19.1  HCT Latest Range: 36.0-46.0 % 41.8  MCV Latest Range: 78.0-100.0 fL 86.5  MCH Latest Range: 26.0-34.0 pg 28.2  MCHC Latest Range: 30.0-36.0 g/dL 47.8  RDW Latest Range: 11.5-15.5 % 13.8  Platelets Latest Range: 150-400 K/uL 255  Neutrophils Relative Latest Range: 43-77 % 58  Lymphocytes Relative Latest Range: 12-46 % 34  Monocytes Relative Latest Range: 3-12 % 6  Eosinophils Relative Latest Range: 0-5 % 2  Basophils Relative Latest Range: 0-1 % 0  NEUT# Latest Range: 1.7-7.7 K/uL 3.1  Lymphocytes Absolute Latest Range: 0.7-4.0 K/uL 1.8  Monocytes Absolute Latest Range: 0.1-1.0 K/uL 0.3  Eosinophils Absolute Latest Range: 0.0-0.7 K/uL 0.1  Basophils  Absolute Latest Range: 0.0-0.1 K/uL 0.0  Smear Review No range found Criteria for review not met  PTH Latest Range: 14.0-72.0 pg/mL 116.1 (H)  TSH Latest Range: 0.350-4.500 uIU/mL 4.117   Since her PTH was elevated and her vitamin D. level was slightly sub-normal at 28 with normal calcium at 9.3 I asked the patient to return to the office to repeat her lab work. On September 10 her calcium was normal at 8.8 vitamin D level normal at 30 but her PTH level continue to increase now with  a value of 157.4.  Patient's neck was examined there was no thyromegaly no palpable masses no carotid bruits and patient otherwise is asymptomatic.  Assessment/plan: Patient will be referred to medical endocrinologist for further evaluation of of her normal calcemic hyperparathyroidism. Literature information on hyperparathyroidism was provided to the patient in Spanish. I'm going toward her comprehensive metabolic panel today as well as a bone density study we'll forward these results to the endocrinologist further review as well.

## 2012-08-29 NOTE — Patient Instructions (Addendum)
Qu es el hiperparatiroidismo? - El hiperparatiroidismo es un trastorno de las glndulas paratiroides en el cuello. Estas glndulas producen una hormona que ayuda a controlar la cantidad de calcio en la sangre y que se denomina "hormona paratiroidea". El hiperparatiroidismo se produce cuando las glndulas paratiroides producen demasiada hormona paratiroidea, lo que hace que se acumule demasiado calcio en la sangre. El hiperparatiroidismo primario, la forma ms comn de hiperparatiroidismo, puede producirse cuando una o ms de las glndulas se agrandan ms de lo debido, o cuando una glndula desarrolla un crecimiento anormal. El cncer es otra causa posible de hiperparatiroidismo, pero es muy poco frecuente. Cules son los sntomas del hiperparatiroidismo? - La mayora de las personas que sufren este padecimiento no tiene sntomas, Manufacturing engineer tienen sntomas que podran estar relacionados con el exceso de calcio en la [place]. Estos sntomas incluyen: TEFL teacher las articulaciones  Sensacin de cansancio o debilidad  Prdida del apetito  Sentirse deprimido  Dificultad para concentrarse Si sus niveles de hormona paratiroidea y de calcio en sangre suben demasiado, podra tener estreimiento, sentir mucha sed u orinar con ms frecuencia de la habitual. International aid/development worker tienen sntomas ms graves, como por ejemplo: Problemas con el funcionamiento de los riones  Clculos renales  Huesos dbiles  Gota (un tipo de artritis) u otro problemas en las articulaciones  Desequilibrios qumicos en la sangre La "crisis paratiroidea" es un problema poco frecuente pero grave. Puede producirse si tiene hiperparatiroidismo y contrae una enfermedad que le hace perder lquidos (como vmitos o diarrea). Esto hace que la cantidad de hormona paratiroidea y calcio en la sangre suba repentinamente. Si eso sucede, podra tener dolor en el rea del estmago, nuseas y, en algunos casos, problemas para pensar con claridad y  Research officer, trade union. Es Merchant navy officer a un mdico o enfermero de inmediato si tiene hiperparatiroidismo y vmitos o diarrea persistentes, y no puede retener los lquidos que ingiere.  Existe alguna prueba para detectar el hiperparatiroidismo? - S. Un mdico o enfermero puede determinar si usted tiene hiperparatiroidismo al [employer] de hormona paratiroidea y calcio en su sangre. Muchas personas con hiperparatiroidismo no notan ningn sntoma. El padecimiento con frecuencia se detecta cuando el mdico o enfermero realiza una prueba de sangre por cualquier otro motivo. Si tiene hiperparatiroidismo, su mdico o enfermero tambin podra hacerle otras pruebas. Es probable que Freight forwarder un tipo especial de radiografa para ver si sus huesos son ms dbiles de lo normal. Adems, es posible que lo examine para Engineer, manufacturing la presencia de clculos renales, en caso de que los haya tenido anteriormente.  Hay algo que pueda hacer para ayudar con el padecimiento? - S. Incluso si no tiene ningn sntoma, existen cosas que puede hacer para evitar problemas: Beba mucho lquido y trate de no deshidratarse. Esto puede ayudar a prevenir los clculos renales Mantngase activo. Esto puede ayudar a Diplomatic Services operational officer de calcio dentro de los valores normales y sus huesos saludables; Intente consumir aproximadamente 1000 miligramos de calcio por da. Estas tablas muestran la cantidad de calcio que contienen ciertos alimentos y suplementos de vitaminas.Intente consumir aproximadamente de 400 a 600 unidades internacionales (UI) de vitamina D todos los [place]. La falta de suficiente vitamina D puede debilitar los huesos. No tome ciertas medicinas que pueden afectar la cantidad de calcio en la sangre. El mdico o enfermero le dirn qu Human resources officer. Incluso si se siente saludable, su mdico o enfermero debe revisar su calcio en sangre cada 6 meses. Tambin le har pruebas peridicas para  revisar sus riones y sus  huesos. (Las personas con [employer] debilitados debido al padecimiento pueden recibir medicinas que las ayuden a [employer]).  Cmo se trata el hiperparatiroidismo? - El principal tratamiento es la ciruga para Development worker, community la glndula o las glndulas que estn causando el problema. (En la [employer], la ciruga cura el hiperparatiroidismo). Sin embargo, las personas que no tienen sntomas no siempre [place].  Es ms probable que necesite ciruga si: La cantidad de calcio en su sangre es muy superior a la normal  Su hiperparatiroidismo est causando problemas en sus riones o huesos  Tiene menos de 50 aos No puede realizarse controles y Engineer, manufacturing peridicas

## 2012-08-30 ENCOUNTER — Telehealth: Payer: Self-pay | Admitting: *Deleted

## 2012-08-30 DIAGNOSIS — E213 Hyperparathyroidism, unspecified: Secondary | ICD-10-CM

## 2012-08-30 LAB — COMPREHENSIVE METABOLIC PANEL
Albumin: 4.2 g/dL (ref 3.5–5.2)
CO2: 28 mEq/L (ref 19–32)
Glucose, Bld: 77 mg/dL (ref 70–99)
Potassium: 4.4 mEq/L (ref 3.5–5.3)
Sodium: 141 mEq/L (ref 135–145)
Total Protein: 6.8 g/dL (ref 6.0–8.3)

## 2012-08-30 NOTE — Telephone Encounter (Signed)
Message copied by Aura Camps on Tue Aug 30, 2012 12:15 PM ------      Message from: Ok Edwards      Created: Tue Aug 30, 2012 10:06 AM       Victorino Dike, please fax to Dr. Lurene Shadow my office note from yesterday along with copy of the labs. Her fax number is [phone]. Also please inform patient that there will be calling from her office to schedule her appointment. Thank you

## 2012-08-30 NOTE — Telephone Encounter (Signed)
Office notes and labs faxed to [facility] office. They will contact pt with appointment date and time and let office know as well.

## 2012-08-31 NOTE — Telephone Encounter (Signed)
Appointment with Dr.Balan 09/27/12 @ 1:30 pm. Left message for pt to call.

## 2012-09-01 NOTE — Telephone Encounter (Signed)
Pt informed with the below note. 

## 2012-09-15 ENCOUNTER — Ambulatory Visit: Payer: 59

## 2012-10-11 ENCOUNTER — Ambulatory Visit (INDEPENDENT_AMBULATORY_CARE_PROVIDER_SITE_OTHER): Payer: 59

## 2012-10-11 DIAGNOSIS — E213 Hyperparathyroidism, unspecified: Secondary | ICD-10-CM

## 2012-10-11 DIAGNOSIS — Z13820 Encounter for screening for osteoporosis: Secondary | ICD-10-CM

## 2013-03-10 ENCOUNTER — Other Ambulatory Visit: Payer: Self-pay

## 2013-03-10 DIAGNOSIS — Z1231 Encounter for screening mammogram for malignant neoplasm of breast: Secondary | ICD-10-CM

## 2013-04-07 ENCOUNTER — Ambulatory Visit
Admission: RE | Admit: 2013-04-07 | Discharge: 2013-04-07 | Disposition: A | Discharge status: Home / Self Care | Payer: 59 | Source: Ambulatory Visit

## 2013-04-07 DIAGNOSIS — Z1231 Encounter for screening mammogram for malignant neoplasm of breast: Secondary | ICD-10-CM

## 2013-08-18 ENCOUNTER — Encounter: Payer: Self-pay | Admitting: Gynecology

## 2013-08-22 ENCOUNTER — Ambulatory Visit (INDEPENDENT_AMBULATORY_CARE_PROVIDER_SITE_OTHER): Payer: 59 | Admitting: Gynecology

## 2013-08-22 ENCOUNTER — Encounter: Payer: Self-pay | Admitting: Gynecology

## 2013-08-22 VITALS — BP 124/80 | Ht 59.25 in | Wt 168.0 lb

## 2013-08-22 DIAGNOSIS — E039 Hypothyroidism, unspecified: Secondary | ICD-10-CM | POA: Insufficient documentation

## 2013-08-22 DIAGNOSIS — Z862 Personal history of diseases of the blood and blood-forming organs and certain disorders involving the immune mechanism: Secondary | ICD-10-CM

## 2013-08-22 DIAGNOSIS — N644 Mastodynia: Secondary | ICD-10-CM

## 2013-08-22 DIAGNOSIS — Z8639 Personal history of other endocrine, nutritional and metabolic disease: Secondary | ICD-10-CM

## 2013-08-22 DIAGNOSIS — T148XXA Other injury of unspecified body region, initial encounter: Secondary | ICD-10-CM

## 2013-08-22 DIAGNOSIS — Z01419 Encounter for gynecological examination (general) (routine) without abnormal findings: Secondary | ICD-10-CM

## 2013-08-22 LAB — CHOLESTEROL, TOTAL: Cholesterol: 152 mg/dL (ref 0–200)

## 2013-08-22 LAB — CBC WITH DIFFERENTIAL/PLATELET
Basophils Relative: 0 % (ref 0–1)
Eosinophils Absolute: 0.2 10*3/uL (ref 0.0–0.7)
Lymphs Abs: 2.1 10*3/uL (ref 0.7–4.0)
MCH: 28.3 pg (ref 26.0–34.0)
MCHC: 33.2 g/dL (ref 30.0–36.0)
Neutro Abs: 4.3 10*3/uL (ref 1.7–7.7)
Neutrophils Relative %: 61 % (ref 43–77)
Platelets: 299 10*3/uL (ref 150–400)
RBC: 4.52 MIL/uL (ref 3.87–5.11)

## 2013-08-22 MED ORDER — CYCLOBENZAPRINE HCL 5 MG PO TABS: ORAL_TABLET | ORAL | Status: DC

## 2013-08-22 NOTE — Patient Instructions (Addendum)
Calcio 500-600 mg una tableta dos veces al dia  vitamina d3 cholecalciferol 2,000 unidades una diaria

## 2013-08-22 NOTE — Progress Notes (Signed)
Natasha Wells 06/06/1971 [id number]   History:    42 y.o.  for annual gyn exam who has not been seen since September 2013. Patient complaining of noticing a right breast tender nodule area for past several weeks. Review of patient's records indicated the following:  #1. Patient then referred to the ENT secondary to right submandibular nodularity that was noted which was firm and tender patient prior to being sent to the ENT head and ultrasound that demonstrated the following:  Findings:  Right thyroid lobe: Measures 4.8 x 1.5 x 0.9 cm.  Left thyroid lobe: Measures 4.1 x 1.1 x 1.0 cm.  Isthmus: Measures 0.2 cm.  Focal nodules: None  Lymphadenopathy: The patient has a submandibular lymph node on the  right measuring 1.5 cm. The node has a prominent fatty hilum.  IMPRESSION:  1. Normal appearing thyroid gland.  2. Benign right submandibular lymph node.  She was treated with antibiotic by the ENT and it resolved.  #2 . Past history of Vitamin D deficiency currently on vitamin D 3 2000 units daily.  #3. Patient with history of hypothyroidism  #4 past history of hyperparathyroidism had been referred and evaluated by endocrinologist Dr. Lurene Shadow  Patient using condoms for contraception. Patient with no prior history of abnormal Pap smears. Patient had a normal mammogram in April 2014    Past medical history,surgical history, family history and social history were all reviewed and documented in the EPIC chart.  Gynecologic History Patient's last menstrual period was 07/26/2012. Contraception: condoms Last Pap: 2012. Results were: normal Last mammogram: 2014. Results were: normal  Obstetric History OB History  Gravida Para Term Preterm AB SAB TAB Ectopic Multiple Living  2 2 2       2     # Outcome Date GA Lbr Len/2nd Weight Sex Delivery Anes PTL Lv  2 TRM     F CS  N Y  1 TRM     M CS  N Y       ROS: A ROS was performed and pertinent positives and negatives are included in the  history.  GENERAL: No fevers or chills. HEENT: No change in vision, no earache, sore throat or sinus congestion. NECK: No pain or stiffness. CARDIOVASCULAR: No chest pain or pressure. No palpitations. PULMONARY: No shortness of breath, cough or wheeze. GASTROINTESTINAL: No abdominal pain, nausea, vomiting or diarrhea, melena or bright red blood per rectum. GENITOURINARY: No urinary frequency, urgency, hesitancy or dysuria. MUSCULOSKELETAL: No joint or muscle pain, no back pain, no recent trauma. DERMATOLOGIC: No rash, no itching, no lesions. ENDOCRINE: No polyuria, polydipsia, no heat or cold intolerance. No recent change in weight. HEMATOLOGICAL: No anemia or easy bruising or bleeding. NEUROLOGIC: No headache, seizures, numbness, tingling or weakness. PSYCHIATRIC: No depression, no loss of interest in normal activity or change in sleep pattern.   Right breast nodule  Exam: chaperone present  BP 124/80  Ht 4' 11.25" (1.505 m)  Wt 168 lb (76.204 kg)  BMI 33.64 kg/m2  LMP 07/26/2012  Body mass index is 33.64 kg/(m^2).  General appearance : Well developed well nourished female. No acute distress HEENT: Neck supple, trachea midline, no carotid bruits, no thyroidmegaly Lungs: Clear to auscultation, no rhonchi or wheezes, or rib retractions  Heart: Regular rate and rhythm, no murmurs or gallops   Both breasts are examined sitting supine position a right breast  Periareolar 1-1/2 cm nodule was noted at the 9:00 position minimally tender. Contralateral breast was normal. No supraclavicular or axillary lymphadenopathy  Abdomen: no palpable masses or tenderness, no rebound or guarding Extremities: no edema or skin discoloration or tenderness  Pelvic:  Bartholin, Urethra, Skene Glands: Within normal limits             Vagina: No gross lesions or discharge  Cervix: No gross lesions or discharge  Uterus  anteverted, normal size, shape and consistency, non-tender and mobile  Adnexa  Without masses or  tenderness  Anus and perineum  normal   Rectovaginal  normal sphincter tone without palpated masses or tenderness             Hemoccult not indicated     Assessment/Plan:  42 y.o. female for annual exam with prior history of hyperparathyroidism and vitamin D deficiency we will check a PTH, calcium, and vitamin D level. Patient with history of hypothyroidism we will check her thyroid level today. A screening cholesterol, CBC and urinalysis will be ordered today. Pap smear not done today the new guidelines discussed. We discussed the calcium and vitamin D dosage that she should be taken daily. A diagnostic mammogram and ultrasound will be ordered for the right breast nodule noted at the periareolar 9:00 position.    Ok Edwards MD, 5:02 PM 08/22/2013

## 2013-08-23 ENCOUNTER — Telehealth: Payer: Self-pay | Admitting: *Deleted

## 2013-08-23 DIAGNOSIS — N63 Unspecified lump in unspecified breast: Secondary | ICD-10-CM

## 2013-08-23 LAB — URINALYSIS W MICROSCOPIC + REFLEX CULTURE
Bacteria, UA: NONE SEEN
Casts: NONE SEEN
Glucose, UA: NEGATIVE mg/dL
Hgb urine dipstick: NEGATIVE
Ketones, ur: NEGATIVE mg/dL
pH: 5 (ref 5.0–8.0)

## 2013-08-23 LAB — TSH: TSH: 2.91 u[IU]/mL (ref 0.350–4.500)

## 2013-08-23 LAB — VITAMIN D 25 HYDROXY (VIT D DEFICIENCY, FRACTURES): Vit D, 25-Hydroxy: 31 ng/mL (ref 30–89)

## 2013-08-23 NOTE — Telephone Encounter (Signed)
Message copied by Aura Camps on Wed Aug 23, 2013  9:45 AM ------      Message from: Ok Edwards      Created: Tue Aug 22, 2013  4:29 PM       Please schedule diagnostic mammogram and ultrasound of right breast:            9 o'clock periareolar region there is a tender 1-1/2 cm nodule. Patient with normal mammogram 03/2013 at the [facility]. ------

## 2013-08-23 NOTE — Telephone Encounter (Signed)
Orders placed for the below, breast center will contact patient.

## 2013-08-31 NOTE — Telephone Encounter (Signed)
Appointment 09/15/13 @ breast center.

## 2013-09-15 ENCOUNTER — Other Ambulatory Visit: Payer: 59

## 2013-09-20 ENCOUNTER — Ambulatory Visit
Admission: RE | Admit: 2013-09-20 | Discharge: 2013-09-20 | Disposition: A | Discharge status: Home / Self Care | Payer: 59 | Source: Ambulatory Visit | Attending: Gynecology | Admitting: Gynecology

## 2013-09-20 DIAGNOSIS — N63 Unspecified lump in unspecified breast: Secondary | ICD-10-CM

## 2013-10-11 ENCOUNTER — Other Ambulatory Visit: Payer: Self-pay | Admitting: Endocrinology

## 2013-10-11 ENCOUNTER — Telehealth: Payer: Self-pay

## 2013-10-11 DIAGNOSIS — E049 Nontoxic goiter, unspecified: Secondary | ICD-10-CM

## 2013-10-11 DIAGNOSIS — E559 Vitamin D deficiency, unspecified: Secondary | ICD-10-CM

## 2013-10-11 MED ORDER — VITAMIN D (ERGOCALCIFEROL) 1.25 MG (50000 UNIT) PO CAPS: 50000.0000 [IU] | ORAL_CAPSULE | ORAL | Status: DC

## 2013-10-11 NOTE — Telephone Encounter (Signed)
Left message for patient to call.

## 2013-10-11 NOTE — Telephone Encounter (Signed)
Natasha Wells spoke with patient and relayed all below.  I sent Rx to pharmacy and put order in for recheck on Vitamin D.

## 2013-10-11 NOTE — Telephone Encounter (Signed)
Per Dr Glenetta Hew: "Please inform patient I reviewed her recent lab work and her vitamin D level was found to be low at 24.3 (normal range 30-100). Please: Prescription for vitamin D 50,000 units to take one tablet orally weekly for 12 weeks. After the 12 weeks I would like her to return back to the vitamin D 3 2000 units daily. For now tell her just to take this prescription along with 600 mg of calcium twice a day. We would need to recheck her vitamin D level after the 12 weeks. "

## 2013-10-16 ENCOUNTER — Ambulatory Visit
Admission: RE | Admit: 2013-10-16 | Discharge: 2013-10-16 | Disposition: A | Discharge status: Home / Self Care | Payer: 59 | Source: Ambulatory Visit | Attending: Endocrinology | Admitting: Endocrinology

## 2013-10-16 DIAGNOSIS — E049 Nontoxic goiter, unspecified: Secondary | ICD-10-CM

## 2013-12-30 ENCOUNTER — Other Ambulatory Visit: Payer: Self-pay | Admitting: Gynecology

## 2014-01-08 ENCOUNTER — Other Ambulatory Visit: Payer: 59

## 2014-01-08 DIAGNOSIS — E559 Vitamin D deficiency, unspecified: Secondary | ICD-10-CM

## 2014-01-09 LAB — VITAMIN D 25 HYDROXY (VIT D DEFICIENCY, FRACTURES): VIT D 25 HYDROXY: 32 ng/mL (ref 30–89)

## 2014-04-03 IMAGING — US US SOFT TISSUE HEAD/NECK
1 series · 14 of 25 positions shown · non-contrast
Comparison: Prior study at [HOSPITAL] on 01/20/2012.

CLINICAL DATA: Thyroid enlargement.

EXAM:
THYROID ULTRASOUND
TECHNIQUE: Ultrasound examination of the thyroid gland and adjacent soft
tissues was performed.

[Series 1: us soft tissue head/neck · 0.06mm/px · 14 of 29 slices shown]
[im 1/29]
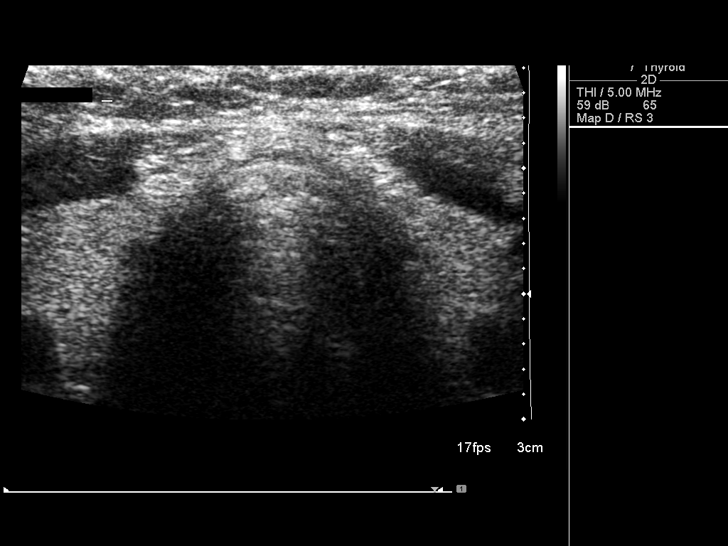
[im 3/29]
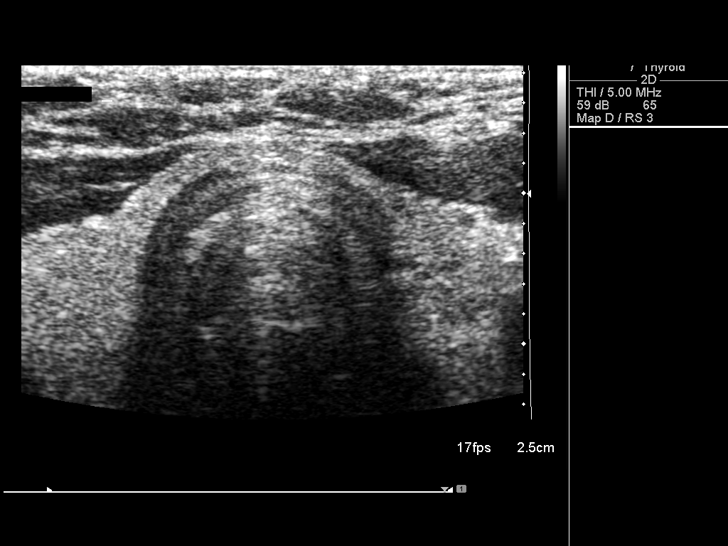
[im 5/29]
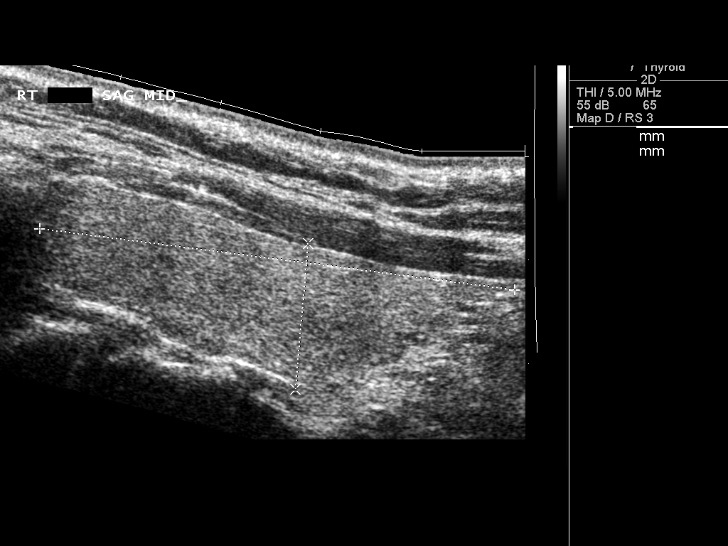
[im 8/29]
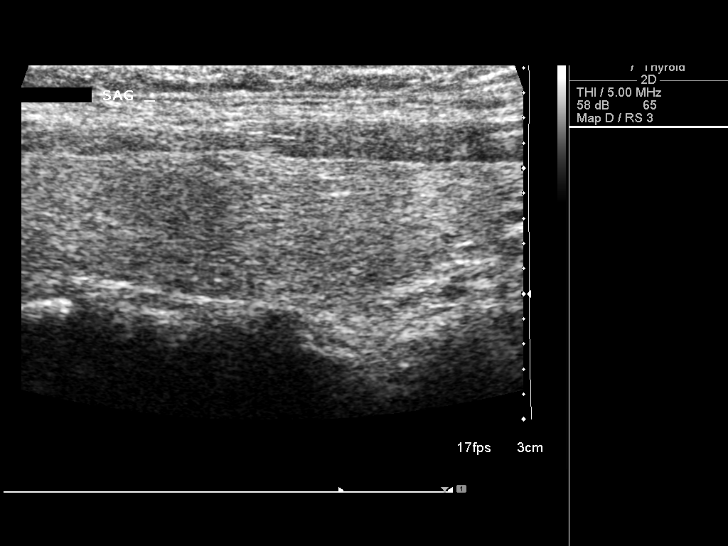
[im 10/29]
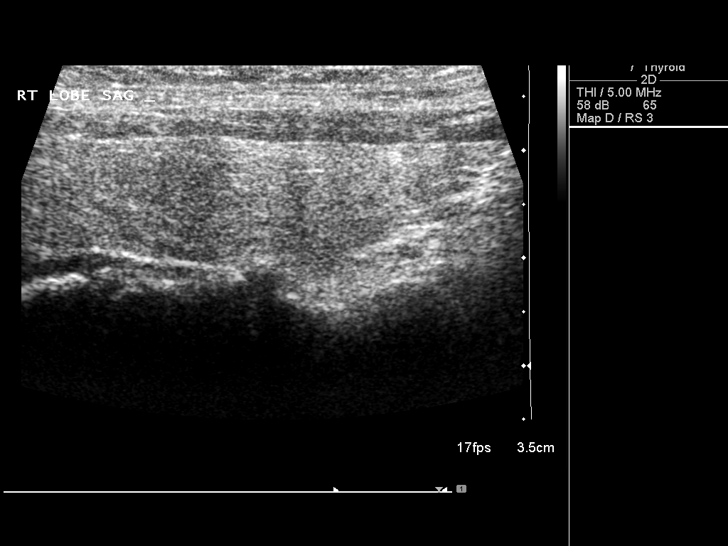
[im 11/29]
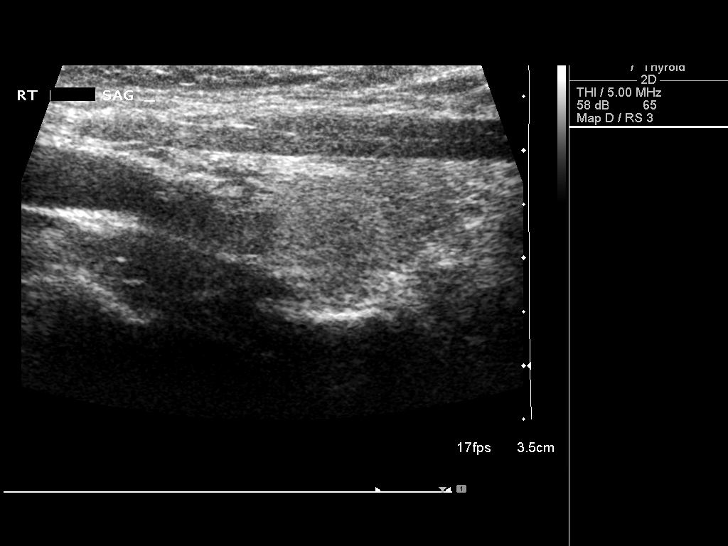
[im 13/29]
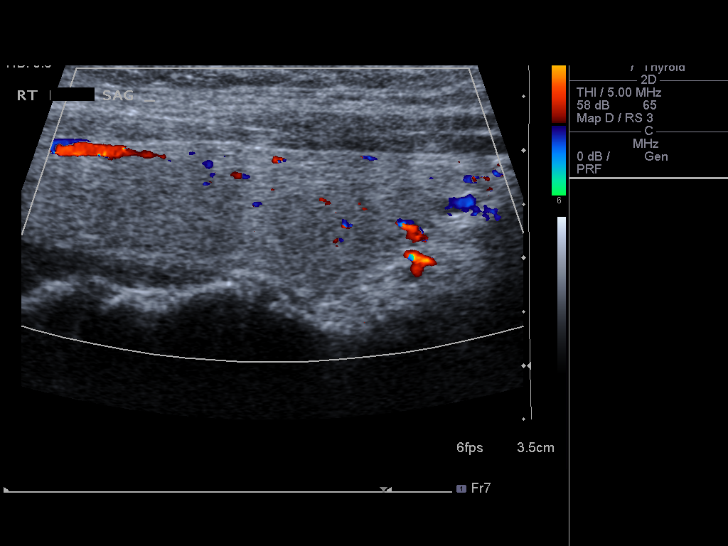
[im 16/29]
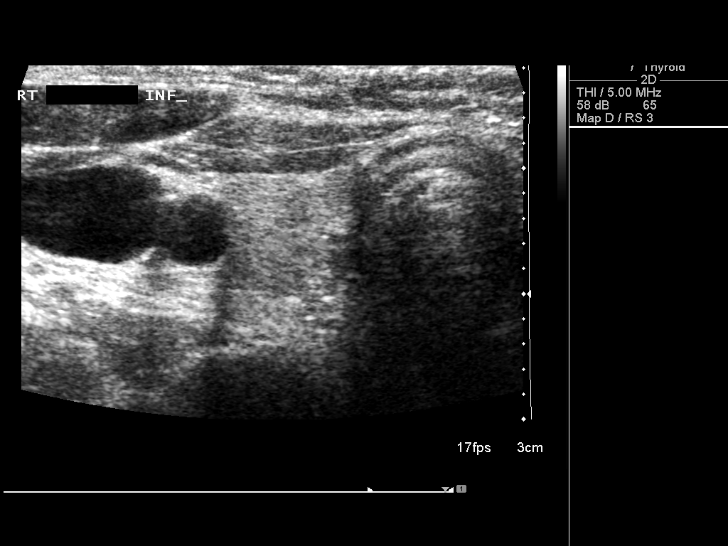
[im 18/29]
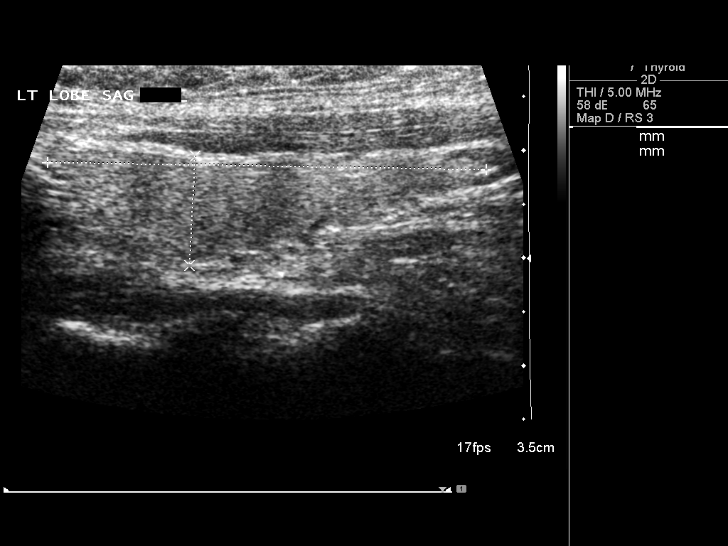
[im 19/29]
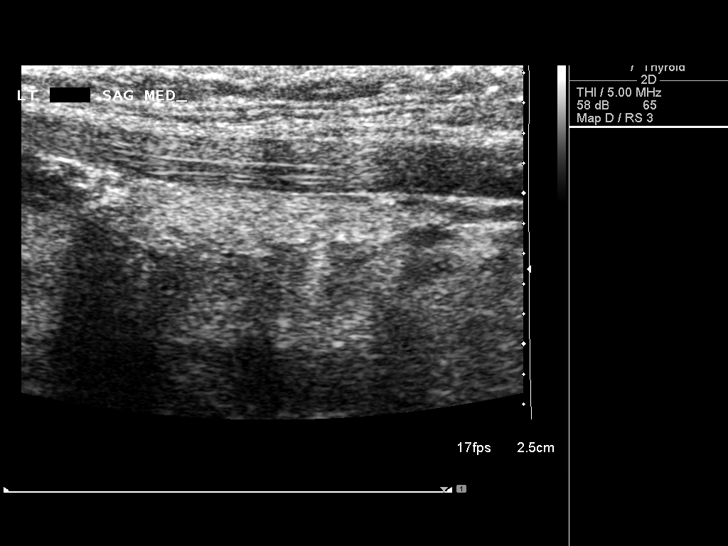
[im 22/29]
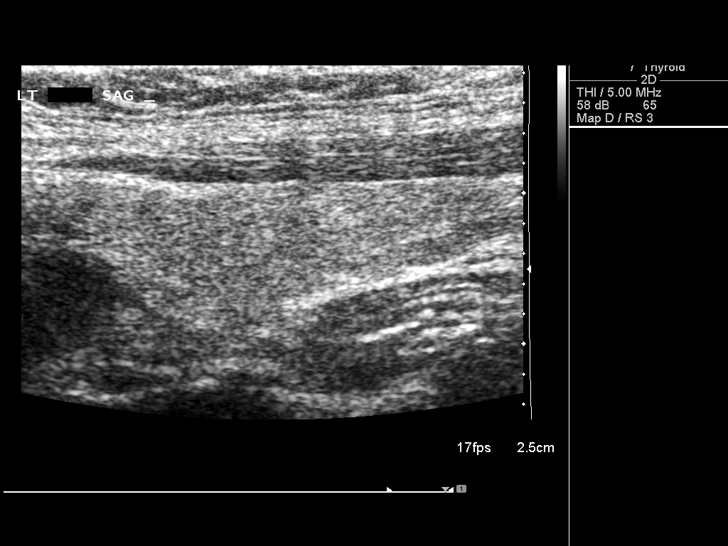
[im 24/29]
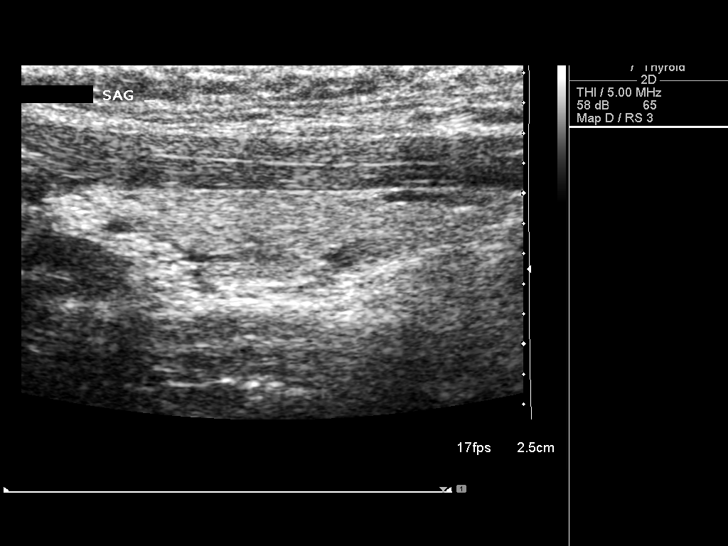
[im 26/29]
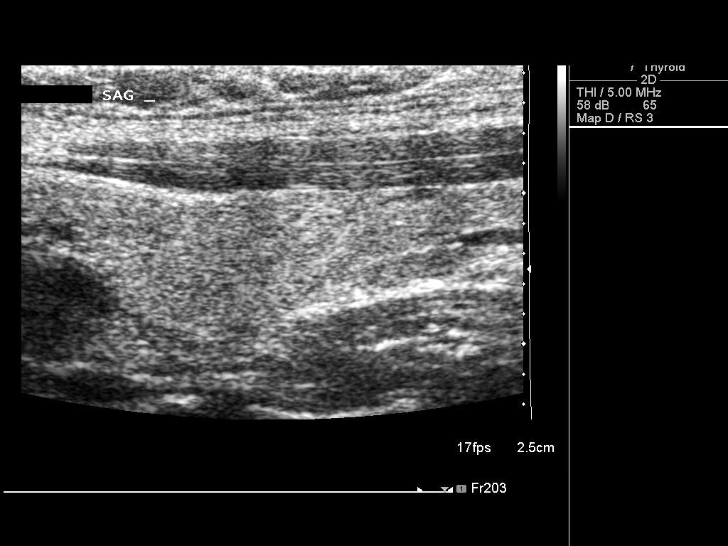
[im 29/29]
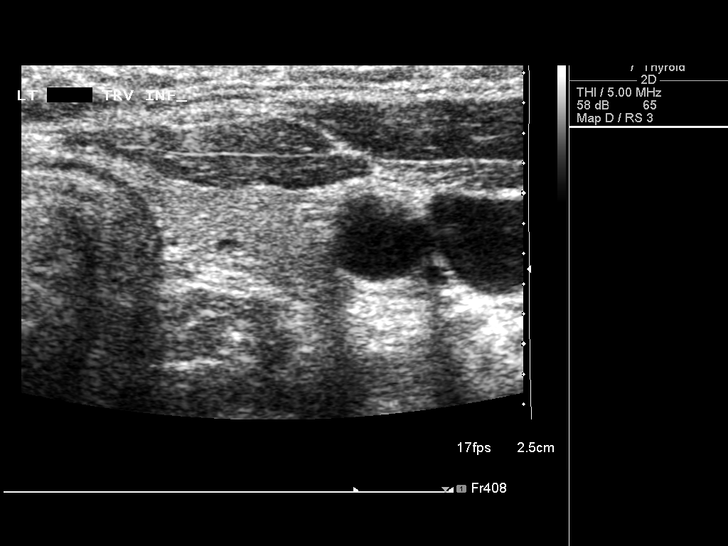

[14 of 25 positions shown; findings below may reference images not displayed]

FINDINGS: Right thyroid lobe

Measurements: 4.6 x 1.4 x 1.3 cm.  No nodules visualized.

Left thyroid lobe

Measurements: 4.1 x 1.0 x 1.1 cm.  No nodules visualized.

Isthmus

Thickness: 0.2 cm.  No nodules visualized.

Lymphadenopathy

None visualized.
IMPRESSION: Thyroid size and echotexture is stable and within normal limits. No
nodules are identified.

## 2014-08-24 ENCOUNTER — Ambulatory Visit (INDEPENDENT_AMBULATORY_CARE_PROVIDER_SITE_OTHER): Payer: 59 | Admitting: Gynecology

## 2014-08-24 ENCOUNTER — Encounter: Payer: Self-pay | Admitting: Gynecology

## 2014-08-24 VITALS — BP 118/76 | Ht 59.75 in | Wt 174.0 lb

## 2014-08-24 DIAGNOSIS — Z23 Encounter for immunization: Secondary | ICD-10-CM

## 2014-08-24 DIAGNOSIS — Z8639 Personal history of other endocrine, nutritional and metabolic disease: Secondary | ICD-10-CM

## 2014-08-24 DIAGNOSIS — G5600 Carpal tunnel syndrome, unspecified upper limb: Secondary | ICD-10-CM

## 2014-08-24 DIAGNOSIS — Z862 Personal history of diseases of the blood and blood-forming organs and certain disorders involving the immune mechanism: Secondary | ICD-10-CM

## 2014-08-24 DIAGNOSIS — G5603 Carpal tunnel syndrome, bilateral upper limbs: Secondary | ICD-10-CM | POA: Insufficient documentation

## 2014-08-24 DIAGNOSIS — Z01419 Encounter for gynecological examination (general) (routine) without abnormal findings: Secondary | ICD-10-CM

## 2014-08-24 DIAGNOSIS — E039 Hypothyroidism, unspecified: Secondary | ICD-10-CM

## 2014-08-24 LAB — COMPREHENSIVE METABOLIC PANEL
ALBUMIN: 4 g/dL (ref 3.5–5.2)
ALT: 21 U/L (ref 0–35)
AST: 19 U/L (ref 0–37)
Alkaline Phosphatase: 51 U/L (ref 39–117)
BUN: 11 mg/dL (ref 6–23)
CO2: 25 mEq/L (ref 19–32)
CREATININE: 0.67 mg/dL (ref 0.50–1.10)
Calcium: 9.1 mg/dL (ref 8.4–10.5)
Chloride: 105 mEq/L (ref 96–112)
Glucose, Bld: 77 mg/dL (ref 70–99)
Potassium: 4.5 mEq/L (ref 3.5–5.3)
Sodium: 137 mEq/L (ref 135–145)
Total Bilirubin: 0.3 mg/dL (ref 0.2–1.2)
Total Protein: 6.6 g/dL (ref 6.0–8.3)

## 2014-08-24 LAB — TSH: TSH: 1.489 u[IU]/mL (ref 0.350–4.500)

## 2014-08-24 LAB — CBC WITH DIFFERENTIAL/PLATELET
BASOS ABS: 0 10*3/uL (ref 0.0–0.1)
Basophils Relative: 0 % (ref 0–1)
Eosinophils Absolute: 0.1 10*3/uL (ref 0.0–0.7)
Eosinophils Relative: 2 % (ref 0–5)
HEMATOCRIT: 39.6 % (ref 36.0–46.0)
Hemoglobin: 13.2 g/dL (ref 12.0–15.0)
Lymphocytes Relative: 33 % (ref 12–46)
Lymphs Abs: 1.9 10*3/uL (ref 0.7–4.0)
MCH: 28 pg (ref 26.0–34.0)
MCHC: 33.3 g/dL (ref 30.0–36.0)
MCV: 83.9 fL (ref 78.0–100.0)
MONO ABS: 0.4 10*3/uL (ref 0.1–1.0)
Monocytes Relative: 7 % (ref 3–12)
NEUTROS ABS: 3.3 10*3/uL (ref 1.7–7.7)
Neutrophils Relative %: 58 % (ref 43–77)
PLATELETS: 266 10*3/uL (ref 150–400)
RBC: 4.72 MIL/uL (ref 3.87–5.11)
RDW: 14.2 % (ref 11.5–15.5)
WBC: 5.7 10*3/uL (ref 4.0–10.5)

## 2014-08-24 LAB — CHOLESTEROL, TOTAL: Cholesterol: 154 mg/dL (ref 0–200)

## 2014-08-24 MED ORDER — LEVOTHYROXINE SODIUM 25 MCG PO TABS: 25.0000 ug | ORAL_TABLET | Freq: Every day | ORAL | Status: DC

## 2014-08-24 NOTE — Patient Instructions (Signed)
Sndrome del tnel carpiano  (Carpal Tunnel Syndrome)  El tnel carpiano es un espacio estrecho ubicado en el lado palmar de la [place]. Est formado por los huesos de la [place] y los ligamentos. Los nervios, vasos sanguneos y tendones pasan a travs del tnel carpiano. Los movimientos de la [place] o ciertas enfermedades pueden causar hinchazn del tnel. Esta hinchazn comprime el nervio principal en la mueca ((nervio mediano) y ocasiona un trastorno doloroso que se denomina sndrome del tnel carpiano. CAUSAS   Movimientos repetidos de [employer].  Lesiones en la [place].  Ciertas enfermedades como la artritis, la diabetes, el alcoholismo, el hipertiroidismo o la insuficiencia renal.  Obesidad.  Embarazo. SNTOMAS   Sensacin de "pinchazos" en los dedos o la mano.  Hormigueo o entumecimiento en los dedos o en la mano.  Sensacin dolorosa en todo el brazo.  Dolor en la mueca que sube por el brazo hasta el hombro.  Dolor que baja por la mano o los dedos.  Sensacin de [employer]. DIAGNSTICO  El mdico le har una historia clnica y un examen fsico. Puede ser necesario hacer un electromiograma. Esta prueba mide las seales elctricas enviadas por los msculos. Generalmente el paso de las seales elctricas es impedido por el sndrome del tnel carpiano. Tambin puede ser necesario que le tomen radiografas.  TRATAMIENTO  El sndrome del tnel carpiano puede curarse espontneamente sin tratamiento. El [employer] indicar el uso de un cabestrillo para la [place] o que tome medicamentos como antiinflamatorios no esteroides. Las inyecciones de cortisona pueden ayudar. A veces es necesaria la ciruga para liberar el nervio comprimido.  [place] todos los medicamentos segn le indic su mdico. Slo tome medicamentos de venta libre o recetados para Glass blower/designer, las molestias o bajar la fiebre segn las indicaciones de su mdico.  Si  le aconsejaron usar un cabestrillo para evitar que la [place] se doble, selo como le indicaron. Es importante que use el cabestrillo durante la noche. selo mientras sienta dolor o adormecimiento en la mano, el brazo o la [place]. Esto puede durar entre 1 y 2 meses.  Haga reposar la [place] de toda actividad que le cause dolor. Si sus sntomas estn relacionados con Leander Rams, deber conversar con su empleador acerca de la posibilidad de cambiar a una tarea que no requiera el uso de la [place].  Aplique hielo en la mueca despus de los perodos prolongados de [place].  Ponga el hielo en una bolsa plstica.  Colquese una toalla entre la piel y la bolsa de hielo.  Deje el hielo en el lugar durante 15 a 20 minutos, 3 a 4 veces por da.  [place] con todas las visitas de control, segn le indique su mdico. Aqu se incluyen derivaciones a un ortopedista, fisioterapia y rehabilitacin. Elmer Bales en obtener la asistencia necesaria puede dar como resultado una demora o fracaso en la curacin. SOLICITE ATENCIN MDICA DE INMEDIATO SI:   Desarrolla nuevos e inexplicables sntomas.  Los sntomas actuales empeoran y la medicacin no los [place]. ASEGRESE DE QUE:   Comprende estas instrucciones.  Controlar su enfermedad.  Solicitar ayuda de inmediato si no mejora o si empeora. Document Released: 11/30/2005 Document Revised: 08/24/2012 [facility] Patient Information 2015 [place]. This information is not intended to replace advice given to you by your health care provider. Make sure you discuss any questions you have with your health care provider. Influenza Virus Vaccine (Flucelvax) Qu es [place]  medicamento? La VACUNA ANTIGRIPAL ayuda a disminuir el riesgo de contraer la influenza, tambin conocida como la gripe. La vacuna solo ayuda a protegerle contra algunas cepas de influenza. Este medicamento puede ser utilizado para otros usos; si tiene alguna pregunta consulte con su proveedor de  atencin mdica o con su farmacutico. MARCAS COMERCIALES DISPONIBLES: Haze Rushing le debo informar a mi profesional de la salud antes de tomar este medicamento? Necesita saber si usted presenta alguno de los siguientes problemas o situaciones: -trastorno de sangrado como hemofilia -fiebre o infeccin -sndrome de Guillain-Barre u otros problemas neurolgicos -problemas del sistema inmunolgico -infeccin por el virus de la inmunodeficiencia humana (VIH) o SIDA -niveles bajos de plaquetas en la sangre -esclerosis mltiple -una reaccin alrgica o inusual a las vacunas antigripales, a otros medicamentos, alimentos, colorantes o conservantes -si est embarazada o buscando quedar embarazada -si est amamantando a un beb Cmo debo utilizar este medicamento? Esta vacuna se administra mediante inyeccin por va intramuscular. Lo administra un profesional de [employer]	[place]. Recibir una copia de informacin escrita sobre la vacuna antes de cada vacuna. Asegrese de leer este folleto cada vez cuidadosamente. Este folleto puede cambiar con frecuencia. Hable con su pediatra para informarse acerca del uso de este medicamento en nios. Puede requerir atencin especial. Sobredosis: Pngase en contacto inmediatamente con un centro toxicolgico o una sala de urgencia si usted cree que haya tomado demasiado medicamento. ATENCIN: [employer] es solo para usted. No comparta este medicamento con nadie. Qu sucede si me olvido de una dosis? No se aplica en este caso. Qu puede interactuar con este medicamento? -quimioterapia o radioterapia -medicamentos que suprimen el sistema inmunolgico, tales como etanercept, anakinra, infliximab y adalimumab -medicamentos que tratan o previenen cogulos sanguneos, como warfarina -fenitona -medicamentos esteroideos, como la prednisona o la cortisona -teofilina -vacunas Puede ser que esta lista no menciona todas las posibles interacciones. Informe a su  profesional de [employer]	[place] de [employer] productos a base de hierbas, medicamentos de [place] o suplementos nutritivos que est tomando. Si usted fuma, consume bebidas alcohlicas o si utiliza drogas ilegales, indqueselo tambin a su profesional de [employer]	[place]. Algunas sustancias pueden interactuar con su medicamento. A qu debo estar atento al usar [employer]? Informe a su mdico o a Barrister's clerk de la [employer] todos los efectos secundarios que persistan despus de 3 das. Llame a su proveedor de atencin mdica si se presentan sntomas inusuales dentro de las 6 semanas de recibir esta vacuna. Es posible que todava pueda contraer la gripe, pero la enfermedad no ser tan fuerte como normalmente. No puede contraer la gripe de esta vacuna. La vacuna antigripal no le protege contra resfros u otras enfermedades que pueden causar [place]. Debe vacunarse cada ao. Qu efectos secundarios puedo tener al [employer] este medicamento? Efectos secundarios que debe informar a su mdico o a Barrister's clerk de la salud tan pronto como sea posible: -reacciones alrgicas como erupcin cutnea, picazn o urticarias, hinchazn de la cara, labios o lengua Efectos secundarios que, por lo general, no requieren atencin mdica (debe informarlos a su mdico o a su profesional de la salud si persisten o si son molestos): -fiebre -dolor de cabeza -molestias y dolores musculares -dolor, sensibilidad, enrojecimiento o Estate agent de la inyeccin -cansancio Puede ser que esta lista no menciona todos los posibles efectos secundarios. Comunquese a su mdico por asesoramiento mdico [employer]. Usted puede informar los efectos secundarios a la FDA por telfono al 1-800-FDA-1088. Dnde debo guardar mi  medicina? Esta vacuna se administrar por un profesional de la salud en una [place], Engineer, mining, consultorio mdico u otro consultorio de un profesional de la salud. No se le suministrar esta  vacuna para guardar en su domicilio. ATENCIN: Este folleto es un resumen. Puede ser que no cubra toda la posible informacin. Si usted tiene preguntas acerca de esta medicina, consulte con su mdico, su farmacutico o su profesional de Technical sales engineer.  2015, Elsevier/Gold Standard. (2011-11-16 16:29:16)

## 2014-08-24 NOTE — Progress Notes (Signed)
Natasha Wells 11-12-1971 [id number]   History:    43 y.o.  for annual gyn exam who has been complaining of numbness and tingling of both hands at the end of the day after work. The patient with past history vitamin D deficiency currently taking vitamin D 3 2000 units daily. Patient with history of hypothyroidism. The patient has had history of hyperparathyroidism in the past and had been evaluated by the endocrinologist Dr. Bubba Camp. Patient is using condoms for contraception and reports normal menstrual cycles. No reported past history of abnormal Pap smears.  Past medical history,surgical history, family history and social history were all reviewed and documented in the EPIC chart.  Gynecologic History Patient's last menstrual period was 08/12/2014. Contraception: condoms Last Pap: 2013. Results were: normal Last mammogram: 2014. Results were: Normal but dense 3-dimensional She's currently using condoms for contraception reports normal menstrual cycle. Obstetric History OB History  Gravida Para Term Preterm AB SAB TAB Ectopic Multiple Living  2 2 2       2     # Outcome Date GA Lbr Len/2nd Weight Sex Delivery Anes PTL Lv  2 TRM     F CS  N Y  1 TRM     M CS  N Y       ROS: A ROS was performed and pertinent positives and negatives are included in the history.  GENERAL: No fevers or chills. HEENT: No change in vision, no earache, sore throat or sinus congestion. NECK: No pain or stiffness. CARDIOVASCULAR: No chest pain or pressure. No palpitations. PULMONARY: No shortness of breath, cough or wheeze. GASTROINTESTINAL: No abdominal pain, nausea, vomiting or diarrhea, melena or bright red blood per rectum. GENITOURINARY: No urinary frequency, urgency, hesitancy or dysuria. MUSCULOSKELETAL: No joint or muscle pain, no back pain, no recent trauma. DERMATOLOGIC: No rash, no itching, no lesions. ENDOCRINE: No polyuria, polydipsia, no heat or cold intolerance. No recent change in weight.  HEMATOLOGICAL: No anemia or easy bruising or bleeding. NEUROLOGIC: No headache, seizures, numbness, tingling or weakness. PSYCHIATRIC: No depression, no loss of interest in normal activity or change in sleep pattern.   Bilateral carpal tunnel tenderness  Exam: chaperone present  BP 118/76  Ht 4' 11.75" (1.518 m)  Wt 174 lb (78.926 kg)  BMI 34.25 kg/m2  LMP 08/12/2014  Body mass index is 34.25 kg/(m^2).  General appearance : Well developed well nourished female. No acute distress HEENT: Neck supple, trachea midline, no carotid bruits, no thyroidmegaly Lungs: Clear to auscultation, no rhonchi or wheezes, or rib retractions  Heart: Regular rate and rhythm, no murmurs or gallops Breast:Examined in sitting and supine position were symmetrical in appearance, no palpable masses or tenderness,  no skin retraction, no nipple inversion, no nipple discharge, no skin discoloration, no axillary or supraclavicular lymphadenopathy Abdomen: no palpable masses or tenderness, no rebound or guarding Extremities: Bilateral carpal tunnel syndrome   Pelvic:  Bartholin, Urethra, Skene Glands: Within normal limits             Vagina: No gross lesions or discharge  Cervix: No gross lesions or discharge  Uterus  anteverted, normal size, shape and consistency, non-tender and mobile  Adnexa  Without masses or tenderness  Anus and perineum  normal   Rectovaginal  normal sphincter tone without palpated masses or tenderness             Hemoccult not indicated     Assessment/Plan:  43 y.o. female for annual exam doing well will have the  following labs ordered: CBC, comprehensive metabolic panel, PT 8, vitamin D, TSH, CBC, and urinalysis. Patient received the flu vaccine today. We discussed importance of monthly self breast examination. We discussed importance of calcium and vitamin D and regular exercise for osteoporosis prevention. Pap smear not done today. Patient with apparent bilateral carpal tunnel syndrome  and be prescribed wrist brace bilateral pleural that this doesn't improve her symptoms next 2-3 months return back to the office and we will refer her to orthopedic surgeon.  Note: This dictation was prepared with  Dragon/digital dictation along withSmart phrase technology. Any transcriptional errors that result from this process are unintentional.   Terrance Mass MD, 5:17 PM 08/24/2014

## 2014-08-25 LAB — VITAMIN D 25 HYDROXY (VIT D DEFICIENCY, FRACTURES): VIT D 25 HYDROXY: 31 ng/mL (ref 30–89)

## 2014-08-27 LAB — PARATHYROID HORMONE, INTACT (NO CA): PTH: 74 pg/mL — AB (ref 14–64)

## 2014-08-28 ENCOUNTER — Other Ambulatory Visit: Payer: Self-pay | Admitting: Gynecology

## 2014-08-28 DIAGNOSIS — E559 Vitamin D deficiency, unspecified: Secondary | ICD-10-CM

## 2014-10-15 ENCOUNTER — Encounter: Payer: Self-pay | Admitting: Gynecology

## 2014-11-14 ENCOUNTER — Other Ambulatory Visit: Payer: Self-pay

## 2014-11-14 DIAGNOSIS — Z1231 Encounter for screening mammogram for malignant neoplasm of breast: Secondary | ICD-10-CM

## 2014-12-04 ENCOUNTER — Ambulatory Visit
Admission: RE | Admit: 2014-12-04 | Discharge: 2014-12-04 | Disposition: A | Discharge status: Home / Self Care | Payer: 59 | Source: Ambulatory Visit

## 2014-12-04 ENCOUNTER — Other Ambulatory Visit: Payer: Self-pay

## 2014-12-04 DIAGNOSIS — Z1231 Encounter for screening mammogram for malignant neoplasm of breast: Secondary | ICD-10-CM

## 2014-12-10 ENCOUNTER — Other Ambulatory Visit: Payer: 59

## 2014-12-10 DIAGNOSIS — E559 Vitamin D deficiency, unspecified: Secondary | ICD-10-CM

## 2014-12-12 ENCOUNTER — Ambulatory Visit: Payer: 59 | Admitting: Gynecology

## 2014-12-13 LAB — VITAMIN D 1,25 DIHYDROXY
Vitamin D 1, 25 (OH)2 Total: 66 pg/mL (ref 18–72)
Vitamin D2 1, 25 (OH)2: 16 pg/mL
Vitamin D3 1, 25 (OH)2: 50 pg/mL

## 2014-12-17 ENCOUNTER — Encounter: Payer: Self-pay | Admitting: Gynecology

## 2014-12-17 ENCOUNTER — Ambulatory Visit (INDEPENDENT_AMBULATORY_CARE_PROVIDER_SITE_OTHER): Payer: 59 | Admitting: Gynecology

## 2014-12-17 DIAGNOSIS — R1032 Left lower quadrant pain: Secondary | ICD-10-CM

## 2014-12-17 DIAGNOSIS — M545 Low back pain, unspecified: Secondary | ICD-10-CM | POA: Insufficient documentation

## 2014-12-17 DIAGNOSIS — Z8639 Personal history of other endocrine, nutritional and metabolic disease: Secondary | ICD-10-CM

## 2014-12-17 DIAGNOSIS — N911 Secondary amenorrhea: Secondary | ICD-10-CM | POA: Insufficient documentation

## 2014-12-17 DIAGNOSIS — M5440 Lumbago with sciatica, unspecified side: Secondary | ICD-10-CM

## 2014-12-17 LAB — URINALYSIS W MICROSCOPIC + REFLEX CULTURE
Bilirubin Urine: NEGATIVE
CASTS: NONE SEEN
Crystals: NONE SEEN
GLUCOSE, UA: NEGATIVE mg/dL
Ketones, ur: NEGATIVE mg/dL
LEUKOCYTES UA: NEGATIVE
Nitrite: NEGATIVE
Protein, ur: NEGATIVE mg/dL
SPECIFIC GRAVITY, URINE: 1.02 (ref 1.005–1.030)
Urobilinogen, UA: 0.2 mg/dL (ref 0.0–1.0)
WBC, UA: NONE SEEN WBC/hpf (ref <3)
pH: 5 (ref 5.0–8.0)

## 2014-12-17 LAB — PREGNANCY, URINE: Preg Test, Ur: NEGATIVE

## 2014-12-17 MED ORDER — MEDROXYPROGESTERONE ACETATE 10 MG PO TABS: 10.0000 mg | ORAL_TABLET | Freq: Every day | ORAL | Status: DC

## 2014-12-17 NOTE — Progress Notes (Signed)
    patient is a 44 year old who presented to the office to discuss several issues. First and foremost patient stated that her last menstrual period was November 1. She denied any unusual headaches she denied any visual disturbances or any nipple discharge. She states that she has been using condoms for contraception. Also patient has complaining over the past week of mild left flank tenderness radiating to her left lower abdomen into her leg. She denies any dysuria or frequency. She denies any breast tenderness. Patient with past history of hyperparathyroidism. Patient was taken vitamin D 2000 units daily and her PTH level was 124.2 and on follow-up 08/24/2014 it was down to 74. She had been followed by the endocrinologist. Her recent vitamin D level December 28 was in the normal range. Patient having normal bowel movements. Patient denied any vaginal discharge.   Exam: Back: Mild tenderness in the left flank area Abdomen: Soft nontender no rebound or guarding Pelvic exam: Bartholin urethra Skene glands within normal limits Vagina: No lesions or discharge Cervix: No lesions or discharge Uterus upper limits of normal no palpable masses or tenderness Adnexa somewhat limited due to abdominal girth but no pain was elicited and no large masses palpated Rectal exam: Not examined  Urine pregnancy test negative Urinalysis 3-6 RBC few bacteria  Assessment/plan: Secondary amenorrhea negative urine pregnancy test. We'll check TSH and prolactin today. If in normal range she'll begin taking Provera 10 mg 1 by mouth daily for 10 days. She will return back next week for pelvic ultrasound to better assess her adnexa. We'll wait for the results of the urine culture. Because of her history of hyperparathyroidism a PTH level be checked today as well. Meanwhile she will continue to hold off on the calcium and vitamin D.

## 2014-12-17 NOTE — Patient Instructions (Signed)
Amenorrea secundaria  (Secondary Amenorrhea ) La amenorrea secundaria es la detencin del flujo menstrual durante 3-6 meses en una mujer que previamente tena sus perodos. Hay muchas causas posibles: La mayora de las causas no son graves. Generalmente, al tratar el problema subyacente que causa la detencin de la [place], podr volver a tener sus perodos normales. CAUSAS  Algunas causas comunes de la falta de menstruacin son:  Desnutricin.  Bajo nivel de Dispensing optician (hipoglucemia).  Enfermedad poliqustica de los ovarios.  Estrs o miedos.  Gwendlyn Deutscher.  Desequilibrio hormonal.  Insuficiencia ovrica.  Medicamentos.  Obesidad extrema.  Fibrosis qustica.  Reduccin de peso drstica por cualquier causa.  Menopausia precoz.  Extirpacin de los ovarios o del tero.  Anticonceptivos.  Enfermedades.  Enfermedades de larga duracin (crnicas).  Sndrome de [place].  Problemas de tiroides.  Pldoras, parches o anillos vaginales para el control de la natalidad. FACTORES DE RIESGO Puede tener ms riesgo de amenorrea secundaria si:  Tiene una historia familiar de este problema.  Sufre un trastorno alimentario.  Realiza entrenamiento deportivo. DIAGNSTICO  Este diagnstico la realiza el mdico por medio de la historia clnica y el examen fsico. Incluir un examen plvico para verificar si hay problemas en los rganos reproductores. Debe descartarse la posibilidad de embarazo. Generalmente se indicarn diferentes anlisis de sangre para medir diferentes tipos de hormonas en el organismo. Le indicarn anlisis de [place]. Le harn algunos estudios especializados (ecografas, tomografa computada, resonancia magntica o histeroscopa) y tambin medirn su ndice de masa corporal [facility]). TRATAMIENTO  El tratamiento depende de la causa de la [place]. Si hay un trastorno de Youth worker, deber tratarse con la dieta y la terapia [place]. Los  trastornos crnicos pueden mejorar con el tratamiento de la enfermedad. La amenorrea puede corregirse con medicamentos, cambios en el estilo de vida o con [place]. Si la amenorrea no puede corregirse, algunas veces es posible crear una falsa menstruacin con medicamentos. INSTRUCCIONES PARA EL CUIDADO EN EL HOGAR  Consuma una dieta saludable.  Controle los problemas de [place].  Haga ejercicios con regularidad, pero no excesivamente.  Duerma lo suficiente.  Controle el estrs.  Observe si hay cambios en el ciclo menstrual. Mantenga un registro del momento en que ocurren los perodos. Anote la fecha de inicio de los perodos, cunto duran y si hay problemas. SOLICITE ATENCIN MDICA SI: Los sntomas no mejoran con Dispensing optician. Document Released: 08/02/2013 [facility] Patient Information 2015 [place]. This information is not intended to replace advice given to you by your health care provider. Make sure you discuss any questions you have with your health care provider.  Medroxyprogesterone tablets Qu es este medicamento? La MEDROXIPROGESTERONA es una hormona de la clase llamada progestinas. Se utiliza comnmente para Producer, television/film/video excesivo del revestimiento uterino en las mujeres tomando un estrgeno despus de la menopausia. Adems se [place] para tratar el sangrado menstrual irregular o la ausencia de sangrado menstrual en mujeres. Este medicamento puede ser utilizado para otros usos; si tiene alguna pregunta consulte con su proveedor de atencin mdica o con su farmacutico. MARCAS COMERCIALES DISPONIBLES: Amen, Provera Qu le debo informar a mi profesional de la salud antes de tomar este medicamento? Necesita saber si usted presenta alguno de los [employer] o situaciones: -enfermedad vascular o antecedente de cogulos sanguneos en los pulmones o las piernas -cncer de mama, cervical o vaginal -enfermedad cardiaca -enfermedad renal -enfermedad  heptica -aborto espontneo o aborto reciente -depresin mental -migraa -convulsiones -derrame cerebral -sangrado vaginal que no  ha sido evaluado -una reaccin alrgica o inusual a la medroxiprogesterona, a otros medicamentos, alimentos, colorantes o conservantes -si est embarazada o buscando quedar embarazada -si est amamantando a un beb Cmo debo utilizar este medicamento? Tome este medicamento por va oral con un vaso de agua. Siga las instrucciones de la etiqueta del [place]. Tome sus dosis a intervalos regulares. No tome su medicamento con una frecuencia mayor a la indicada. Hable con su pediatra para informarse acerca del uso de este medicamento en nios. Puede requerir atencin especial. Aunque este medicamento ha sido recetado a nios tan menores como de 13 aos de edad para condiciones selectivas, las precauciones se aplican. Sobredosis: Pngase en contacto inmediatamente con un centro toxicolgico o una sala de urgencia si usted cree que haya tomado demasiado medicamento. ATENCIN: [employer] es solo para usted. No comparta este medicamento con nadie. Qu sucede si me olvido de una dosis? Si olvida una dosis, tmela lo antes posible. Si es casi la hora de la prxima dosis, tome slo esa dosis. No tome dosis adicionales o dobles. Qu puede interactuar con este medicamento? -barbitricos para inducir el sueo o para tratar los convulsiones -bosentano -carbamazepina -fenitona -rifampicina -hierba de [place] Puede ser que esta lista no menciona todas las posibles interacciones. Informe a su profesional de [employer]	[place] de [employer] productos a base de hierbas, medicamentos de [place] o suplementos nutritivos que est tomando. Si usted fuma, consume bebidas alcohlicas o si utiliza drogas ilegales, indqueselo tambin a su profesional de [employer]	[place]. Algunas sustancias pueden interactuar con su medicamento. A qu debo estar atento al usar [employer]? Visite a su  profesional de la salud para Immunologist. Deber hacerse exmenes de las mamas y la pelvis en forma regular. Si piensa que est embarazada deje de tomar este medicamento de inmediato y comunquese con su mdico o con su profesional de Technical sales engineer. Qu efectos secundarios puedo tener al [employer] este medicamento? Efectos secundarios que debe informar a su mdico o a Barrister's clerk de la salud tan pronto como sea posible: -secreciones o sensibilidad de las mamas -cambios de estados de nimo o emociones, tal como depresin -cambios en la visin o el habla -dolor en el abdomen, pecho, entrepierna o piernas -dolor de cabeza severo -erupcin cutnea, picazn o urticarias -falta de aliento repentina -cansancio o debilidad inusual -color amarillento de ojos o piel Efectos secundarios que, por lo general, no requieren atencin mdica (debe informarlos a su mdico o a su profesional de la salud si persisten o si son molestos): -acn -cambios en el sangrado vaginal -cambios en el deseo sexual -crecimiento de vello en el rostro -retencin de lquidos e hinchazn -dolor de cabeza -Higher education careers adviser -aumento o prdida de peso Puede ser que esta lista no menciona todos los posibles efectos secundarios. Comunquese a su mdico por asesoramiento mdico [employer] Usted puede informar los efectos secundarios a la FDA por telfono al 1-800-FDA-1088. Dnde debo guardar mi medicina? Mantngala fuera del alcance de los nios. [place] a [employer], entre 20 y 49 grados C (77 y 56 grados F). Deseche todo medicamento sin utilizar despus de su fecha de vencimiento. ATENCIN: Este folleto es un resumen. Puede ser que no cubra toda la posible informacin. Si usted tiene preguntas acerca de esta medicina, consulte con su mdico, su farmacutico o su profesional de Technical sales engineer.  2015, Elsevier/Gold Standard. (2009-01-18 16:51:24)

## 2014-12-17 NOTE — Addendum Note (Signed)
Addended by: Thurnell Garbe A on: 12/17/2014 04:33 PM   Modules accepted: Orders

## 2014-12-18 LAB — TSH: TSH: 3.703 u[IU]/mL (ref 0.350–4.500)

## 2014-12-18 LAB — PROLACTIN: PROLACTIN: 10.1 ng/mL

## 2014-12-19 LAB — PARATHYROID HORMONE, INTACT (NO CA): PTH: 87 pg/mL — ABNORMAL HIGH (ref 14–64)

## 2014-12-20 LAB — URINE CULTURE: Colony Count: 25000

## 2014-12-25 ENCOUNTER — Telehealth: Payer: Self-pay | Admitting: *Deleted

## 2014-12-25 DIAGNOSIS — E213 Hyperparathyroidism, unspecified: Secondary | ICD-10-CM

## 2014-12-25 NOTE — Telephone Encounter (Signed)
Per Dr. Moshe Salisbury "Please make sure patient has an appointment to see her endocrinologist for her primary hyperparathyroidism. Send him a copy of these lab results of her PTH and recent vitamin D level."

## 2014-12-25 NOTE — Telephone Encounter (Signed)
Referral placed for the below at [place]. They will contact pt to schedule

## 2014-12-28 ENCOUNTER — Ambulatory Visit (INDEPENDENT_AMBULATORY_CARE_PROVIDER_SITE_OTHER): Payer: 59

## 2014-12-28 ENCOUNTER — Other Ambulatory Visit: Payer: Self-pay | Admitting: Gynecology

## 2014-12-28 ENCOUNTER — Ambulatory Visit (INDEPENDENT_AMBULATORY_CARE_PROVIDER_SITE_OTHER): Payer: 59 | Admitting: Gynecology

## 2014-12-28 ENCOUNTER — Encounter: Payer: Self-pay | Admitting: Gynecology

## 2014-12-28 DIAGNOSIS — N912 Amenorrhea, unspecified: Secondary | ICD-10-CM

## 2014-12-28 DIAGNOSIS — R1032 Left lower quadrant pain: Secondary | ICD-10-CM

## 2014-12-28 DIAGNOSIS — M5440 Lumbago with sciatica, unspecified side: Secondary | ICD-10-CM

## 2014-12-28 DIAGNOSIS — N938 Other specified abnormal uterine and vaginal bleeding: Secondary | ICD-10-CM

## 2014-12-28 DIAGNOSIS — R140 Abdominal distension (gaseous): Secondary | ICD-10-CM

## 2014-12-28 DIAGNOSIS — M5442 Lumbago with sciatica, left side: Secondary | ICD-10-CM

## 2014-12-28 DIAGNOSIS — N911 Secondary amenorrhea: Secondary | ICD-10-CM

## 2014-12-28 DIAGNOSIS — E213 Hyperparathyroidism, unspecified: Secondary | ICD-10-CM

## 2014-12-28 MED ORDER — MEDROXYPROGESTERONE ACETATE 10 MG PO TABS: ORAL_TABLET | ORAL | Status: DC

## 2014-12-28 MED ORDER — TRAMADOL HCL 50 MG PO TABS: 50.0000 mg | ORAL_TABLET | Freq: Four times a day (QID) | ORAL | Status: DC | PRN

## 2014-12-28 NOTE — Patient Instructions (Addendum)
Ciática  °(Sciatica) ° La ciática es el dolor, debilidad, entumecimiento u hormigueo a lo largo del nervio ciático. El nervio comienza en la zona inferior de la espalda y desciende por la parte posterior de cada pierna. El nervio controla los músculos de la parte inferior de la pierna y de la zona posterior de la rodilla, y transmite la sensibilidad a la parte posterior del muslo, la pierna y la planta del pie. La ciática es un síntoma de otras afecciones médicas. Por ejemplo, un daño a los nervios o algunas enfermedades como un disco herniado o un espolón óseo en la columna vertebral, podrían dañarle o presionar en el nervio ciático. Esto causa dolor, debilidad y otras sensaciones normalmente asociadas con la ciática. Generalmente la ciática afecta sólo un lado del cuerpo. °CAUSAS  °· Disco herniado o desplazado. °· Enfermedad degenerativa del disco. °· Un síndrome doloroso que compromete un músculo angosto de los glúteos (síndrome piriforme). °· Lesión o fractura pélvica. °· Embarazo. °· Tumor (casos raros). °SÍNTOMAS  °Los síntomas pueden variar de leves a muy graves. Por lo general, los síntomas descienden desde la zona lumbar a las nalgas y la parte posterior de la pierna. Ellos son:  °· Hormigueo leve o dolor sordo en la parte inferior de la espalda, la pierna o la cadera. °· Adormecimiento en la parte posterior de la pantorrilla o la planta del pie. °· Sensación de quemazón en la zona lumbar, la pierna o la cadera. °· Dolor agudo en la zona inferior de la espalda, la pierna o la cadera. °· Debilidad en las piernas. °· Dolor de espalda intenso que inhibe los movimientos. °Los síntomas pueden empeorar al toser, estornudar, reír o estar sentado o parado durante mucho tiempo. Además, el sobrepeso puede empeorar los síntomas.  °DIAGNÓSTICO  °Su médico le hará un examen físico para buscar los síntomas comunes de la ciática. Le pedirá que haga algunos movimientos o actividades que activarían el dolor del nervio  ciático. Para encontrar las causas de la ciática podrá indicarle otros estudios. Estos pueden ser:  °· Análisis de sangre. °· Radiografías. °· Pruebas de diagnóstico por imágenes, como resonancia magnética o tomografía computada. °TRATAMIENTO  °El tratamiento se dirige a las causas de la ciática. A veces, el tratamiento no es necesario, y el dolor y el malestar desaparecen por sí mismos. Si necesita tratamiento, su médico puede sugerir:  °· Medicamentos de venta libre para aliviar el dolor. °· Medicamentos recetados, como antiinflamatorios, relajantes musculares o narcóticos. °· Aplicación de calor o hielo en la zona del dolor. °· Inyecciones de corticoides para disminuir el dolor, la irritación y la inflamación alrededor del nervio. °· Reducción de la actividad en los períodos de dolor. °· Ejercicios y estiramiento del abdomen para fortalecer y mejorar la flexibilidad de la columna vertebral. Su médico puede sugerirle perder peso si el peso extra empeora el dolor de espalda. °· Fisioterapia. °· La cirugía para eliminar lo que presiona o pincha el nervio, como un espolón óseo o parte de una hernia de disco. °INSTRUCCIONES PARA EL CUIDADO EN EL HOGAR  °· Sólo tome medicamentos de venta libre o recetados para calmar el dolor o el malestar, según las indicaciones de su médico. °· Aplique hielo sobre el área dolorida durante 20 minutos 3-4 veces por día durante los primeras 48-72 horas. Luego intente aplicar calor de la misma manera. °· Haga ejercicios, elongue o realice sus actividades habituales, si no le causan más dolor. °· Cumpla con todas las sesiones de fisioterapia, según le   indique su mdico.  Cumpla con todas las visitas de control, segn le indique su mdico.  No use tacones altos o zapatos que no tengan buen apoyo.  Verifique que el colchn no sea muy blando. Un colchn firme Best boy y las [place]. SOLICITE ATENCIN MDICA DE INMEDIATO SI:   Pierde el control de la vejiga o del intestino  (incontinencia).  Aumenta la debilidad en la zona inferior de la espalda, la pelvis, las nalgas o las piernas.  Siente irritacin o inflamacin en la espalda.  Tiene sensacin de ardor al [employer].  El dolor empeora cuando se acuesta o lo despierta por la noche.  El dolor es peor del que experiment en el pasado.  Dura ms de 4 semanas.  Pierde peso sin motivo de [place] sbita. ASEGRESE DE QUE:   Comprende estas instrucciones.  Controlar su enfermedad.  Solicitar ayuda de inmediato si no mejora o si empeora. Document Released: 11/30/2005 Document Revised: 05/31/2012 [facility] Patient Information 2015 [place], [place]. This information is not intended to replace advice given to you by your health care provider. Make sure you discuss any questions you have with your health care provider.   Recuerdate de llamar a Dr. Renne Crigler Endocrinologa para cita.

## 2014-12-28 NOTE — Progress Notes (Signed)
   Patient presented to the office today for pelvic ultrasound. Patient with history of secondary amenorrhea. She had been using condoms for contraception. When she was seen in the office on January 4 her urine precipitous was negative and she was prescribed Provera 10 mg 1 by mouth daily for 10 days which she recently finished and started her menses today. She had a normal TSH and prolactin. She is being followed by her medical endocrinologist for her hyperparathyroidism and when it was checked recently on last office visit and went from 30-87.  Sonohysterogram today: Uterus measured 9.6 x 6.3 x 4.7 cm with endometrial stripe of 19.5 mm (menses started today) normal uterus and ovaries. No free fluid seen. The cervix was cleansed with Betadine solution and a sterile catheter was introduced into the uterine cavity and no intracavitary defects were noted.  With a sterile [place] and endometrial biopsy was obtained and submitted for histological evaluation  Assessment/plan: 44 year old patient with history of secondary amenorrhea probably attributed to her being overweight. Recent TSH and and prolactin were normal. Patient with history of hyperparathyroidism will make an appointment to follow-up with medical endocrinologist. Patient will be prescribed Provera 10 mg to take 1 by mouth daily for 10 days of each month if she does not have a spontaneous menses. It appears the discomfort sometimes radiates from her back to her left leg could be radiculopathy possible underlying sciatica. She's going to be prescribed Ultram 50 mg take 1 by mouth every 6 hours when necessary. If her symptoms persist after 3 months and her cycles become regular with her without the Provera she will need referral to the orthopedic surgeon.

## 2015-01-07 NOTE — Telephone Encounter (Signed)
Pt sees Dr.Balan and will not need referral

## 2015-02-06 ENCOUNTER — Ambulatory Visit: Payer: 59 | Admitting: Internal Medicine

## 2015-02-11 ENCOUNTER — Ambulatory Visit: Payer: Self-pay | Admitting: Internal Medicine

## 2016-01-06 ENCOUNTER — Other Ambulatory Visit (HOSPITAL_COMMUNITY)
Admission: RE | Admit: 2016-01-06 | Discharge: 2016-01-06 | Disposition: A | Discharge status: Home / Self Care | Payer: 59 | Source: Ambulatory Visit | Attending: Gynecology | Admitting: Gynecology

## 2016-01-06 ENCOUNTER — Ambulatory Visit (INDEPENDENT_AMBULATORY_CARE_PROVIDER_SITE_OTHER): Payer: 59 | Admitting: Gynecology

## 2016-01-06 ENCOUNTER — Encounter: Payer: Self-pay | Admitting: Gynecology

## 2016-01-06 VITALS — BP 130/78 | Ht 59.0 in | Wt 163.0 lb

## 2016-01-06 DIAGNOSIS — E038 Other specified hypothyroidism: Secondary | ICD-10-CM | POA: Diagnosis not present

## 2016-01-06 DIAGNOSIS — Z1151 Encounter for screening for human papillomavirus (HPV): Secondary | ICD-10-CM | POA: Diagnosis not present

## 2016-01-06 DIAGNOSIS — Z01419 Encounter for gynecological examination (general) (routine) without abnormal findings: Secondary | ICD-10-CM | POA: Diagnosis not present

## 2016-01-06 DIAGNOSIS — Z8639 Personal history of other endocrine, nutritional and metabolic disease: Secondary | ICD-10-CM | POA: Diagnosis not present

## 2016-01-06 LAB — CBC WITH DIFFERENTIAL/PLATELET
Basophils Absolute: 0 10*3/uL (ref 0.0–0.1)
Basophils Relative: 0 % (ref 0–1)
EOS ABS: 0.2 10*3/uL (ref 0.0–0.7)
Eosinophils Relative: 3 % (ref 0–5)
HCT: 39.5 % (ref 36.0–46.0)
Hemoglobin: 13 g/dL (ref 12.0–15.0)
Lymphocytes Relative: 37 % (ref 12–46)
Lymphs Abs: 1.9 10*3/uL (ref 0.7–4.0)
MCH: 28 pg (ref 26.0–34.0)
MCHC: 32.9 g/dL (ref 30.0–36.0)
MCV: 84.9 fL (ref 78.0–100.0)
MONOS PCT: 6 % (ref 3–12)
MPV: 9.8 fL (ref 8.6–12.4)
Monocytes Absolute: 0.3 10*3/uL (ref 0.1–1.0)
Neutro Abs: 2.8 10*3/uL (ref 1.7–7.7)
Neutrophils Relative %: 54 % (ref 43–77)
Platelets: 296 10*3/uL (ref 150–400)
RBC: 4.65 MIL/uL (ref 3.87–5.11)
RDW: 13.8 % (ref 11.5–15.5)
WBC: 5.2 10*3/uL (ref 4.0–10.5)

## 2016-01-06 LAB — THYROID PANEL WITH TSH
FREE THYROXINE INDEX: 2.2 (ref 1.4–3.8)
T3 Uptake: 26 % (ref 22–35)
T4, Total: 8.6 ug/dL (ref 4.5–12.0)
TSH: 1.711 u[IU]/mL (ref 0.350–4.500)

## 2016-01-06 LAB — COMPREHENSIVE METABOLIC PANEL
ALBUMIN: 3.9 g/dL (ref 3.6–5.1)
ALT: 15 U/L (ref 6–29)
AST: 12 U/L (ref 10–30)
Alkaline Phosphatase: 45 U/L (ref 33–115)
BUN: 13 mg/dL (ref 7–25)
CO2: 21 mmol/L (ref 20–31)
CREATININE: 0.64 mg/dL (ref 0.50–1.10)
Calcium: 8.8 mg/dL (ref 8.6–10.2)
Chloride: 104 mmol/L (ref 98–110)
GLUCOSE: 81 mg/dL (ref 65–99)
Potassium: 4.2 mmol/L (ref 3.5–5.3)
Sodium: 138 mmol/L (ref 135–146)
Total Bilirubin: 0.3 mg/dL (ref 0.2–1.2)
Total Protein: 6.7 g/dL (ref 6.1–8.1)

## 2016-01-06 LAB — LIPID PANEL
Cholesterol: 121 mg/dL — ABNORMAL LOW (ref 125–200)
HDL: 48 mg/dL (ref >=46)
LDL CALC: 55 mg/dL (ref <130)
Total CHOL/HDL Ratio: 2.5 Ratio (ref <=5.0)
Triglycerides: 89 mg/dL (ref <150)
VLDL: 18 mg/dL (ref <30)

## 2016-01-06 NOTE — Progress Notes (Signed)
Natasha Wells 1971/11/17 [id number]   History:    45 y.o.  for annual gyn exam who was seen in January of last year with complaint of secondary and menorrhea. She had a normal TSH and and prolactin and had been administered Provera for 10 days and she withdrew. She also had a normal ultrasound and sonohysterogram at that time as well.  Patient with history of hyperparathyroidism had been referred to Dr. Bubba Camp medical endocrinologist and had also diagnosed with hypothyroidism for which she's currently on Synthroid 50 g daily. She now reports normal menstrual cycle using condoms for contraception. She had a normal bone density study in 2013. Vaccine is up-to-date. Patient with no previous history of any abnormal Pap smear.  Past medical history,surgical history, family history and social history were all reviewed and documented in the EPIC chart.  Gynecologic History Patient's last menstrual period was 12/09/2015. Contraception: condoms Last Pap: 2012. Results were: normal Last mammogram: 2015. Results were: normal  Obstetric History OB History  Gravida Para Term Preterm AB SAB TAB Ectopic Multiple Living  2 2 2       2     # Outcome Date GA Lbr Len/2nd Weight Sex Delivery Anes PTL Lv  2 Term     F CS-Unspec  N Y  1 Term     M CS-Unspec  N Y       ROS: A ROS was performed and pertinent positives and negatives are included in the history.  GENERAL: No fevers or chills. HEENT: No change in vision, no earache, sore throat or sinus congestion. NECK: No pain or stiffness. CARDIOVASCULAR: No chest pain or pressure. No palpitations. PULMONARY: No shortness of breath, cough or wheeze. GASTROINTESTINAL: No abdominal pain, nausea, vomiting or diarrhea, melena or bright red blood per rectum. GENITOURINARY: No urinary frequency, urgency, hesitancy or dysuria. MUSCULOSKELETAL: No joint or muscle pain, no back pain, no recent trauma. DERMATOLOGIC: No rash, no itching, no lesions. ENDOCRINE: No  polyuria, polydipsia, no heat or cold intolerance. No recent change in weight. HEMATOLOGICAL: No anemia or easy bruising or bleeding. NEUROLOGIC: No headache, seizures, numbness, tingling or weakness. PSYCHIATRIC: No depression, no loss of interest in normal activity or change in sleep pattern.     Exam: chaperone present  BP 130/78 mmHg  Ht 4\' 11"  (1.499 m)  Wt 163 lb (73.936 kg)  BMI 32.90 kg/m2  LMP 12/09/2015  Body mass index is 32.9 kg/(m^2).  General appearance : Well developed well nourished female. No acute distress HEENT: Eyes: no retinal hemorrhage or exudates,  Neck supple, trachea midline, no carotid bruits, no thyroidmegaly Lungs: Clear to auscultation, no rhonchi or wheezes, or rib retractions  Heart: Regular rate and rhythm, no murmurs or gallops Breast:Examined in sitting and supine position were symmetrical in appearance, no palpable masses or tenderness,  no skin retraction, no nipple inversion, no nipple discharge, no skin discoloration, no axillary or supraclavicular lymphadenopathy Abdomen: no palpable masses or tenderness, no rebound or guarding Extremities: no edema or skin discoloration or tenderness  Pelvic:  [place], Urethra, Skene Glands: Within normal limits             Vagina: No gross lesions or discharge  Cervix: No gross lesions or discharge  Uterus  anteverted, normal size, shape and consistency, non-tender and mobile  Adnexa  Without masses or tenderness  Anus and perineum  normal   Rectovaginal  normal sphincter tone without palpated masses or tenderness  Hemoccult not indicated     Assessment/Plan:  45 y.o. female for annual exam to have the following screening blood work drawn today: Comprehensive metabolic panel, fasting lipid profile, thyroid panel, CBC, urinalysis as well as PTH and vitamin D level. Patient to follow-up with her endocrinologist at the next few weeks. Patient also to schedule bone density study as well as her  mammogram. Pap smear was done today with HPV screen.   Terrance Mass MD, 10:58 AM 01/06/2016

## 2016-01-06 NOTE — Addendum Note (Signed)
Addended by: Burnett Kanaris on: 01/06/2016 11:06 AM   Modules accepted: Orders

## 2016-01-07 ENCOUNTER — Encounter: Payer: Self-pay | Admitting: Gynecology

## 2016-01-07 ENCOUNTER — Other Ambulatory Visit: Payer: Self-pay | Admitting: Gynecology

## 2016-01-07 DIAGNOSIS — E349 Endocrine disorder, unspecified: Secondary | ICD-10-CM

## 2016-01-07 DIAGNOSIS — E559 Vitamin D deficiency, unspecified: Secondary | ICD-10-CM

## 2016-01-07 LAB — URINALYSIS W MICROSCOPIC + REFLEX CULTURE
BACTERIA UA: NONE SEEN [HPF]
Bilirubin Urine: NEGATIVE
CASTS: NONE SEEN [LPF]
CRYSTALS: NONE SEEN [HPF]
Glucose, UA: NEGATIVE
HGB URINE DIPSTICK: NEGATIVE
KETONES UR: NEGATIVE
Leukocytes, UA: NEGATIVE
Nitrite: NEGATIVE
PH: 5 (ref 5.0–8.0)
PROTEIN: NEGATIVE
Specific Gravity, Urine: 1.019 (ref 1.001–1.035)
WBC, UA: NONE SEEN WBC/HPF (ref <=5)
Yeast: NONE SEEN [HPF]

## 2016-01-07 LAB — PARATHYROID HORMONE, INTACT (NO CA): PTH: 81 pg/mL — ABNORMAL HIGH (ref 14–64)

## 2016-01-07 LAB — CYTOLOGY - PAP

## 2016-01-07 LAB — VITAMIN D 25 HYDROXY (VIT D DEFICIENCY, FRACTURES): Vit D, 25-Hydroxy: 28 ng/mL — ABNORMAL LOW (ref 30–100)

## 2016-01-07 MED ORDER — VITAMIN D (ERGOCALCIFEROL) 1.25 MG (50000 UNIT) PO CAPS: 50000.0000 [IU] | ORAL_CAPSULE | ORAL | Status: DC

## 2016-01-08 LAB — URINE CULTURE: Colony Count: 7000

## 2016-02-24 ENCOUNTER — Other Ambulatory Visit: Payer: Self-pay | Admitting: Gynecology

## 2016-02-24 ENCOUNTER — Ambulatory Visit (INDEPENDENT_AMBULATORY_CARE_PROVIDER_SITE_OTHER): Payer: 59

## 2016-02-24 DIAGNOSIS — Z13820 Encounter for screening for osteoporosis: Secondary | ICD-10-CM

## 2016-02-24 DIAGNOSIS — Z8639 Personal history of other endocrine, nutritional and metabolic disease: Secondary | ICD-10-CM

## 2016-03-05 ENCOUNTER — Other Ambulatory Visit: Payer: Self-pay

## 2016-03-05 DIAGNOSIS — Z1231 Encounter for screening mammogram for malignant neoplasm of breast: Secondary | ICD-10-CM

## 2016-03-06 ENCOUNTER — Ambulatory Visit
Admission: RE | Admit: 2016-03-06 | Discharge: 2016-03-06 | Disposition: A | Discharge status: Home / Self Care | Payer: 59 | Source: Ambulatory Visit

## 2016-03-06 DIAGNOSIS — Z1231 Encounter for screening mammogram for malignant neoplasm of breast: Secondary | ICD-10-CM

## 2016-04-03 ENCOUNTER — Other Ambulatory Visit: Payer: Self-pay | Admitting: Gynecology

## 2016-11-02 ENCOUNTER — Ambulatory Visit (HOSPITAL_COMMUNITY)
Admission: EM | Admit: 2016-11-02 | Discharge: 2016-11-02 | Disposition: A | Discharge status: Home / Self Care | Payer: 59 | Attending: Emergency Medicine | Admitting: Emergency Medicine

## 2016-11-02 ENCOUNTER — Encounter (HOSPITAL_COMMUNITY): Payer: Self-pay | Admitting: Emergency Medicine

## 2016-11-02 DIAGNOSIS — M5442 Lumbago with sciatica, left side: Secondary | ICD-10-CM

## 2016-11-02 LAB — POCT URINALYSIS DIP (DEVICE)
BILIRUBIN URINE: NEGATIVE
GLUCOSE, UA: NEGATIVE mg/dL
Ketones, ur: NEGATIVE mg/dL
LEUKOCYTES UA: NEGATIVE
NITRITE: NEGATIVE
Protein, ur: NEGATIVE mg/dL
SPECIFIC GRAVITY, URINE: 1.015 (ref 1.005–1.030)
UROBILINOGEN UA: 0.2 mg/dL (ref 0.0–1.0)
pH: 6 (ref 5.0–8.0)

## 2016-11-02 LAB — POCT PREGNANCY, URINE: Preg Test, Ur: NEGATIVE

## 2016-11-02 MED ORDER — IBUPROFEN 800 MG PO TABS: 800.0000 mg | ORAL_TABLET | Freq: Three times a day (TID) | ORAL | 0 refills | Status: DC

## 2016-11-02 MED ORDER — METHOCARBAMOL 500 MG PO TABS: 500.0000 mg | ORAL_TABLET | Freq: Two times a day (BID) | ORAL | 0 refills | Status: DC

## 2016-11-02 NOTE — ED Provider Notes (Signed)
CSN: [id number]     Arrival date & time 11/02/16  1003 History   None    Chief Complaint  Patient presents with  . Back Pain   (Consider location/radiation/quality/duration/timing/severity/associated sxs/prior Treatment) The history is provided by the patient. No language interpreter was used.  Back Pain  Location:  Lumbar spine Quality:  Aching Radiates to:  Does not radiate Pain severity:  Moderate Onset quality:  Gradual Duration:  2 days Timing:  Constant Progression:  Worsening Chronicity:  New Relieved by:  Nothing Worsened by:  Nothing Ineffective treatments:  None tried Associated symptoms: no dysuria and no pelvic pain   Risk factors: no steroid use   Pt complains of pain down her left leg,  Pain with moving  Past Medical History:  Diagnosis Date  . Allergic rhinitis   . Birth control    condoms  . Vitamin D deficiency    Past Surgical History:  Procedure Laterality Date  . CESAREAN SECTION     x 2   Family History  Problem Relation Age of Onset  . Lung cancer Brother   . Cancer Brother     lung cancer  . Leukemia      nephew  . Cancer Other     nephew, leukemia   . Hypertension Neg Hx   . Stroke Neg Hx    Social History  Substance Use Topics  . Smoking status: Never Smoker  . Smokeless tobacco: Never Used  . Alcohol use No   OB History    Gravida Para Term Preterm AB Living   2 2 2     2    SAB TAB Ectopic Multiple Live Births           2     Review of Systems  Genitourinary: Negative for dysuria, hematuria, menstrual problem and pelvic pain.  Musculoskeletal: Positive for back pain.  All other systems reviewed and are negative.   Allergies  Patient has no known allergies.  Home Medications   Prior to Admission medications   Medication Sig Start Date End Date Taking? Authorizing Provider  amoxicillin (AMOXIL) 500 MG capsule as directed. 12/31/15   Historical Provider, MD  cetirizine (ZYRTEC) 10 MG tablet Take 10 mg by mouth as  needed. Reported on 01/06/2016    Historical Provider, MD  ibuprofen (ADVIL,MOTRIN) 800 MG tablet Take 1 tablet (800 mg total) by mouth 3 (three) times daily. 11/02/16   Fransico Meadow, PA-C  levothyroxine (SYNTHROID, LEVOTHROID) 50 MCG tablet Take 1 tablet by mouth daily. 12/19/15   Historical Provider, MD  methocarbamol (ROBAXIN) 500 MG tablet Take 1 tablet (500 mg total) by mouth 2 (two) times daily. 11/02/16   Fransico Meadow, PA-C  Vitamin D, Ergocalciferol, (DRISDOL) 50000 units CAPS capsule Take 1 capsule (50,000 Units total) by mouth every 7 (seven) days. 01/07/16   Terrance Mass, MD   Meds Ordered and Administered this Visit  Medications - No data to display  BP (!) 98/48   Pulse 64   Temp 98.4 F (36.9 C) (Oral)   Resp 16   Ht 5' (1.524 m)   Wt 160 lb (72.6 kg)   LMP 10/24/2016   SpO2 100%   BMI 31.25 kg/m  No data found.   Physical Exam  Constitutional: She is oriented to person, place, and time. She appears well-developed and well-nourished.  HENT:  Head: Normocephalic.  Eyes: EOM are normal.  Neck: Normal range of motion.  Cardiovascular: Normal rate and regular rhythm.  Pulmonary/Chest: Effort normal.  Abdominal: She exhibits no distension.  Musculoskeletal: Normal range of motion.  Tender left sciatica notch.  nv and ns intact pain with moving  Neurological: She is alert and oriented to person, place, and time.  Psychiatric: She has a normal mood and affect.  Nursing note and vitals reviewed.   Urgent Care Course   Clinical Course     Procedures (including critical care time)  Labs Review Labs Reviewed  POCT PREGNANCY, URINE    Imaging Review No results found.   Visual Acuity Review  Right Eye Distance:   Left Eye Distance:   Bilateral Distance:    Right Eye Near:   Left Eye Near:    Bilateral Near:         MDM Pt advised to follow up with    1. Acute left-sided low back pain with left-sided sciatica    Meds ordered this encounter   Medications  . methocarbamol (ROBAXIN) 500 MG tablet    Sig: Take 1 tablet (500 mg total) by mouth 2 (two) times daily.    Dispense:  20 tablet    Refill:  0  . ibuprofen (ADVIL,MOTRIN) 800 MG tablet    Sig: Take 1 tablet (800 mg total) by mouth 3 (three) times daily.    Dispense:  21 tablet    Refill:  0  An After Visit Summary was printed and given to the patient.    [place], PA-C 11/02/16 [place], [place] 11/02/16 [phone]

## 2016-11-02 NOTE — ED Triage Notes (Signed)
PT reports lower back pain that shoots down left leg. PT denies urinary symptoms. PT denies injury.

## 2017-01-08 ENCOUNTER — Ambulatory Visit (INDEPENDENT_AMBULATORY_CARE_PROVIDER_SITE_OTHER): Payer: 59 | Admitting: Gynecology

## 2017-01-08 ENCOUNTER — Encounter: Payer: Self-pay | Admitting: Gynecology

## 2017-01-08 VITALS — BP 128/80 | Ht 59.0 in | Wt 181.0 lb

## 2017-01-08 DIAGNOSIS — E038 Other specified hypothyroidism: Secondary | ICD-10-CM | POA: Diagnosis not present

## 2017-01-08 DIAGNOSIS — E559 Vitamin D deficiency, unspecified: Secondary | ICD-10-CM

## 2017-01-08 DIAGNOSIS — E213 Hyperparathyroidism, unspecified: Secondary | ICD-10-CM | POA: Diagnosis not present

## 2017-01-08 DIAGNOSIS — Z01419 Encounter for gynecological examination (general) (routine) without abnormal findings: Secondary | ICD-10-CM

## 2017-01-08 DIAGNOSIS — Z23 Encounter for immunization: Secondary | ICD-10-CM

## 2017-01-08 DIAGNOSIS — R300 Dysuria: Secondary | ICD-10-CM

## 2017-01-08 LAB — CBC WITH DIFFERENTIAL/PLATELET
BASOS ABS: 0 {cells}/uL (ref 0–200)
Basophils Relative: 0 %
EOS ABS: 220 {cells}/uL (ref 15–500)
EOS PCT: 4 %
HCT: 39.8 % (ref 35.0–45.0)
Hemoglobin: 12.7 g/dL (ref 11.7–15.5)
LYMPHS PCT: 37 %
Lymphs Abs: 2035 cells/uL (ref 850–3900)
MCH: 26.7 pg — AB (ref 27.0–33.0)
MCHC: 31.9 g/dL — AB (ref 32.0–36.0)
MCV: 83.6 fL (ref 80.0–100.0)
MPV: 9.6 fL (ref 7.5–12.5)
Monocytes Absolute: 330 cells/uL (ref 200–950)
Monocytes Relative: 6 %
NEUTROS PCT: 53 %
Neutro Abs: 2915 cells/uL (ref 1500–7800)
PLATELETS: 291 10*3/uL (ref 140–400)
RBC: 4.76 MIL/uL (ref 3.80–5.10)
RDW: 14.3 % (ref 11.0–15.0)
WBC: 5.5 10*3/uL (ref 3.8–10.8)

## 2017-01-08 LAB — URINALYSIS W MICROSCOPIC + REFLEX CULTURE
BILIRUBIN URINE: NEGATIVE
CASTS: NONE SEEN [LPF]
Crystals: NONE SEEN [HPF]
Glucose, UA: NEGATIVE
Ketones, ur: NEGATIVE
LEUKOCYTES UA: NEGATIVE
NITRITE: NEGATIVE
PH: 5 (ref 5.0–8.0)
Yeast: NONE SEEN [HPF]

## 2017-01-08 MED ORDER — LEVOTHYROXINE SODIUM 50 MCG PO TABS: 50.0000 ug | ORAL_TABLET | Freq: Every day | ORAL | 11 refills | Status: DC

## 2017-01-08 MED ORDER — CLOBETASOL PROPIONATE 0.05 % EX CREA: 1.0000 "application " | TOPICAL_CREAM | Freq: Two times a day (BID) | CUTANEOUS | 2 refills | Status: DC

## 2017-01-08 NOTE — Addendum Note (Signed)
Addended by: Burnett Kanaris on: 01/08/2017 02:00 PM   Modules accepted: Orders

## 2017-01-08 NOTE — Patient Instructions (Signed)
Influenza Virus Vaccine (Flucelvax) Qu es este medicamento? La VACUNA ANTIGRIPAL ayuda a disminuir el riesgo de contraer la influenza, tambin conocida como la gripe. La vacuna solo ayuda a protegerle contra algunas cepas de influenza. MARCAS COMUNES: FLUCELVAX Qu le debo informar a mi profesional de la salud antes de tomar este medicamento? Necesita saber si usted presenta alguno de los siguientes problemas o situaciones: -trastorno de sangrado como hemofilia -fiebre o infeccin -sndrome de Guillain-Barre u otros problemas neurolgicos -problemas del sistema inmunolgico -infeccin por el virus de la inmunodeficiencia humana (VIH) o SIDA -niveles bajos de plaquetas en la sangre -esclerosis mltiple -una reaccin alrgica o inusual a las vacunas antigripales, a otros medicamentos, alimentos, colorantes o conservantes -si est embarazada o buscando quedar embarazada -si est amamantando a un beb Cmo debo utilizar este medicamento? Esta vacuna se administra mediante inyeccin por va intramuscular. Lo administra un profesional de [employer]	[place]. Recibir una copia de informacin escrita sobre la vacuna antes de cada vacuna. Asegrese de leer este folleto cada vez cuidadosamente. Este folleto puede cambiar con frecuencia. Hable con su pediatra para informarse acerca del uso de este medicamento en nios. Puede requerir atencin especial. Qu sucede si me Meyer Cory dosis? No se aplica en este caso. Qu puede interactuar con este medicamento? -quimioterapia o radioterapia -medicamentos que suprimen el sistema inmunolgico, tales como etanercept, anakinra, infliximab y adalimumab -medicamentos que tratan o previenen cogulos sanguneos, como warfarina -fenitona -medicamentos esteroideos, como la prednisona o la cortisona -teofilina -vacunas A qu debo estar atento al usar [employer]? Informe a su mdico o a Barrister's clerk de la [employer] todos los efectos secundarios que  persistan despus de 3 das. Llame a su proveedor de atencin mdica si se presentan sntomas inusuales dentro de las 6 semanas de recibir esta vacuna. Es posible que todava pueda contraer la gripe, pero la enfermedad no ser tan fuerte como normalmente. No puede contraer la gripe de esta vacuna. La vacuna antigripal no le protege contra resfros u otras enfermedades que pueden causar [place]. Debe vacunarse cada ao. Qu efectos secundarios puedo tener al [employer] este medicamento? Efectos secundarios que debe informar a su mdico o a Barrister's clerk de la salud tan pronto como sea posible: -Chief of Staff como erupcin cutnea, picazn o urticarias, hinchazn de la cara, labios o lengua Efectos secundarios que, por lo general, no requieren atencin mdica (debe informarlos a su mdico o a su profesional de la salud si persisten o si son molestos): -fiebre -dolor de cabeza -molestias y dolores musculares -dolor, sensibilidad, enrojecimiento o hinchazn en el lugar de la inyeccin -cansancio Dnde debo guardar mi medicina? Esta vacuna se administrar por un profesional de la salud en una [place], Engineer, mining, consultorio mdico u otro consultorio de un profesional de la salud. No se le suministrar esta vacuna para guardar en su domicilio.  2017 Elsevier/[employer] (2011-11-16 16:29:16)

## 2017-01-08 NOTE — Progress Notes (Addendum)
Natasha Wells 04/05/1971 [id number]   History:    46 y.o.  for annual gyn exam with a complaint of occasional urinary frequency but at times she relates and was irritation of perirectal region which started a few weeks ago. Patient reports no vaginal discharge. Patient has history of hypothyroidism and hyperparathyroidism where she has been followed by the endocrinologist Dr. Bubba Camp. Patient ran out of her Synthroid and has not taken her medication for month. She did state that she skipped one menstrual cycle back in December but recently had a menstrual cycle this January. Patient wishing to have the flu vaccine today. She's otherwise reporting normal menstrual cycles and using condoms for contraception. She has had history vitamin D deficiency in the past but did not return for follow-up after she was treated for 3 months. Patient with no past history of abnormal Pap smears.  Past medical history,surgical history, family history and social history were all reviewed and documented in the EPIC chart.  Gynecologic History Patient's last menstrual period was 12/31/2016. Contraception: condoms Last Pap: 2017. Results were: normal Last mammogram: 2017. Results were: normal  Obstetric History OB History  Gravida Para Term Preterm AB Living  2 2 2     2   SAB TAB Ectopic Multiple Live Births          2    # Outcome Date GA Lbr Len/2nd Weight Sex Delivery Anes PTL Lv  2 Term     F CS-Unspec  N LIV  1 Term     M CS-Unspec  N LIV       ROS: A ROS was performed and pertinent positives and negatives are included in the history.  GENERAL: No fevers or chills. HEENT: No change in vision, no earache, sore throat or sinus congestion. NECK: No pain or stiffness. CARDIOVASCULAR: No chest pain or pressure. No palpitations. PULMONARY: No shortness of breath, cough or wheeze. GASTROINTESTINAL: No abdominal pain, nausea, vomiting or diarrhea, melena or bright red blood per rectum. GENITOURINARY: No  urinary frequency, urgency, hesitancy or dysuria. MUSCULOSKELETAL: No joint or muscle pain, no back pain, no recent trauma. DERMATOLOGIC: No rash, no itching, no lesions. ENDOCRINE: No polyuria, polydipsia, no heat or cold intolerance. No recent change in weight. HEMATOLOGICAL: No anemia or easy bruising or bleeding. NEUROLOGIC: No headache, seizures, numbness, tingling or weakness. PSYCHIATRIC: No depression, no loss of interest in normal activity or change in sleep pattern.     Exam: chaperone present  BP 128/80   Ht 4\' 11"  (1.499 m)   Wt 181 lb (82.1 kg)   LMP 12/31/2016   BMI 36.56 kg/m   Body mass index is 36.56 kg/m.  General appearance : Well developed well nourished female. No acute distress HEENT: Eyes: no retinal hemorrhage or exudates,  Neck supple, trachea midline, no carotid bruits, no thyroidmegaly Lungs: Clear to auscultation, no rhonchi or wheezes, or rib retractions  Heart: Regular rate and rhythm, no murmurs or gallops Breast:Examined in sitting and supine position were symmetrical in appearance, no palpable masses or tenderness,  no skin retraction, no nipple inversion, no nipple discharge, no skin discoloration, no axillary or supraclavicular lymphadenopathy Abdomen: no palpable masses or tenderness, no rebound or guarding Extremities: no edema or skin discoloration or tenderness  Pelvic:  Bartholin, Urethra, Skene Glands: Within normal limits             Vagina: No gross lesions or discharge  Cervix: No gross lesions or discharge  Uterus  anteverted, normal size,  shape and consistency, non-tender and mobile  Adnexa  Without masses or tenderness  Anus and perineum  normal   Rectovaginal  normal sphincter tone without palpated masses or tenderness             Hemoccult not indicated   Urinalysis today moderate bacteria, 3-10 RBC, 0-5 WBC urine culture pending  Assessment/Plan:  46 y.o. female for annual exam is scheduled to see the endocrinologist in the next  couple weeks. We are going to check her thyroid panel today but in the event that it is abnormal we'll hold off on changing her 50 g of Synthroid since she was off the medication when she ran not for one month. If asked the case her endocrinologist will need to repeat her thyroid function tests at that visit when she has restarted her medication. Because of her history of hyperparathyroidism we'll going to check her PTH level today. Since she is fasting the following screening blood work was ordered today: Comprehensive metabolic panel, fasting lipid profile, TSH, CBC, and urinalysis. Because of her history vitamin D deficiency in the past we'll check a vitamin D level as well today. She is going to be prescribed clobetasol 0.05% to apply perirectal perineal area which she has had some irritation at times to apply 3 times a week for the next couple weeks then once a week on a when necessary basis. Patient received the flu vaccine today. She was reminded to schedule her 3-D mammogram this year.   Terrance Mass MD, 10:40 AM 01/08/2017

## 2017-01-09 LAB — COMPREHENSIVE METABOLIC PANEL
ALT: 14 U/L (ref 6–29)
AST: 14 U/L (ref 10–30)
Albumin: 4.2 g/dL (ref 3.6–5.1)
Alkaline Phosphatase: 60 U/L (ref 33–115)
BUN: 16 mg/dL (ref 7–25)
CHLORIDE: 107 mmol/L (ref 98–110)
CO2: 22 mmol/L (ref 20–31)
Calcium: 9.6 mg/dL (ref 8.6–10.2)
Creat: 0.8 mg/dL (ref 0.50–1.10)
Glucose, Bld: 87 mg/dL (ref 65–99)
POTASSIUM: 4.7 mmol/L (ref 3.5–5.3)
Sodium: 145 mmol/L (ref 135–146)
TOTAL PROTEIN: 7.5 g/dL (ref 6.1–8.1)
Total Bilirubin: 0.2 mg/dL (ref 0.2–1.2)

## 2017-01-09 LAB — THYROID PANEL WITH TSH
Free Thyroxine Index: 1.8 (ref 1.4–3.8)
T3 Uptake: 24 % (ref 22–35)
T4, Total: 7.5 ug/dL (ref 4.5–12.0)
TSH: 2.58 mIU/L

## 2017-01-09 LAB — VITAMIN D 25 HYDROXY (VIT D DEFICIENCY, FRACTURES): Vit D, 25-Hydroxy: 21 ng/mL — ABNORMAL LOW (ref 30–100)

## 2017-01-09 LAB — LIPID PANEL
CHOL/HDL RATIO: 3.4 ratio (ref <5.0)
CHOLESTEROL: 179 mg/dL (ref <200)
HDL: 52 mg/dL (ref >50)
LDL CALC: 108 mg/dL — AB (ref <100)
TRIGLYCERIDES: 97 mg/dL (ref <150)
VLDL: 19 mg/dL (ref <30)

## 2017-01-10 LAB — URINE CULTURE

## 2017-01-11 LAB — PARATHYROID HORMONE, INTACT (NO CA): PTH: 85 pg/mL — AB (ref 14–64)

## 2017-01-13 ENCOUNTER — Other Ambulatory Visit: Payer: Self-pay | Admitting: Anesthesiology

## 2017-01-13 DIAGNOSIS — E349 Endocrine disorder, unspecified: Secondary | ICD-10-CM

## 2017-01-13 DIAGNOSIS — E559 Vitamin D deficiency, unspecified: Secondary | ICD-10-CM

## 2017-01-29 ENCOUNTER — Other Ambulatory Visit: Payer: Self-pay | Admitting: Anesthesiology

## 2017-01-29 MED ORDER — VITAMIN D (ERGOCALCIFEROL) 1.25 MG (50000 UNIT) PO CAPS: 50000.0000 [IU] | ORAL_CAPSULE | ORAL | 0 refills | Status: DC

## 2017-03-08 ENCOUNTER — Other Ambulatory Visit: Payer: Self-pay | Admitting: Gynecology

## 2017-03-08 DIAGNOSIS — Z1231 Encounter for screening mammogram for malignant neoplasm of breast: Secondary | ICD-10-CM

## 2017-03-24 ENCOUNTER — Ambulatory Visit
Admission: RE | Admit: 2017-03-24 | Discharge: 2017-03-24 | Disposition: A | Discharge status: Home / Self Care | Payer: 59 | Source: Ambulatory Visit | Attending: Gynecology | Admitting: Gynecology

## 2017-03-24 DIAGNOSIS — Z1231 Encounter for screening mammogram for malignant neoplasm of breast: Secondary | ICD-10-CM

## 2017-04-28 ENCOUNTER — Encounter: Payer: Self-pay | Admitting: Gynecology

## 2018-01-10 ENCOUNTER — Encounter: Payer: 59 | Admitting: Obstetrics & Gynecology

## 2018-01-14 ENCOUNTER — Ambulatory Visit (INDEPENDENT_AMBULATORY_CARE_PROVIDER_SITE_OTHER): Payer: 59 | Admitting: Obstetrics & Gynecology

## 2018-01-14 ENCOUNTER — Encounter: Payer: Self-pay | Admitting: Obstetrics & Gynecology

## 2018-01-14 VITALS — BP 132/84 | Ht 59.75 in | Wt 188.0 lb

## 2018-01-14 DIAGNOSIS — N920 Excessive and frequent menstruation with regular cycle: Secondary | ICD-10-CM

## 2018-01-14 DIAGNOSIS — Z01419 Encounter for gynecological examination (general) (routine) without abnormal findings: Secondary | ICD-10-CM

## 2018-01-14 DIAGNOSIS — Z789 Other specified health status: Secondary | ICD-10-CM

## 2018-01-14 NOTE — Progress Notes (Signed)
Natasha Wells [birth year] [id number]   History:    47 y.o. G2P2L2 Married.  Children doing well, 31 and 21 yo.  RP:  Established patient presenting for annual gyn exam and menorrhagia  HPI: Using condoms.  Menses every month lasting 5 days, except the last period 12/14/2017 which lasted 3 weeks.  No pelvic pain.  Normal secretions.  Urine/BMs wnl.  Breasts wnl.  Dr Chalmers Cater following for Hypothyroidism/h/o Hyperparathyroidism.  Will f/u here for Health labs.  Not very physically active.  BMI 37.02.  Past medical history,surgical history, family history and social history were all reviewed and documented in the EPIC chart.  Gynecologic History Patient's last menstrual period was 12/14/2017. Contraception: condoms Last Pap: 12/2015. Results were: Negative Last mammogram: 03/2017. Results were: Negative Bone Density: 02/2016 Normal Colonoscopy: Never  Obstetric History OB History  Gravida Para Term Preterm AB Living  _0 SAB TAB Ectopic Multiple Live Births          2    # Outcome Date GA Lbr Len/2nd Weight Sex Delivery Anes PTL Lv  2 Term     F CS-Unspec  N LIV  1 Term     M CS-Unspec  N LIV       ROS: A ROS was performed and pertinent positives and negatives are included in the history.  GENERAL: No fevers or chills. HEENT: No change in vision, no earache, sore throat or sinus congestion. NECK: No pain or stiffness. CARDIOVASCULAR: No chest pain or pressure. No palpitations. PULMONARY: No shortness of breath, cough or wheeze. GASTROINTESTINAL: No abdominal pain, nausea, vomiting or diarrhea, melena or bright red blood per rectum. GENITOURINARY: No urinary frequency, urgency, hesitancy or dysuria. MUSCULOSKELETAL: No joint or muscle pain, no back pain, no recent trauma. DERMATOLOGIC: No rash, no itching, no lesions. ENDOCRINE: No polyuria, polydipsia, no heat or cold intolerance. No recent change in weight. HEMATOLOGICAL: No anemia or easy bruising or bleeding. NEUROLOGIC: No  headache, seizures, numbness, tingling or weakness. PSYCHIATRIC: No depression, no loss of interest in normal activity or change in sleep pattern.     Exam:   BP 132/84   Ht 4' 11.75" (1.518 m)   Wt 188 lb (85.3 kg)   LMP 12/14/2017   BMI 37.02 kg/m   Body mass index is 37.02 kg/m.  General appearance : Well developed well nourished female. No acute distress HEENT: Eyes: no retinal hemorrhage or exudates,  Neck supple, trachea midline, no carotid bruits, no thyroidmegaly Lungs: Clear to auscultation, no rhonchi or wheezes, or rib retractions  Heart: Regular rate and rhythm, no murmurs or gallops Breast:Examined in sitting and supine position were symmetrical in appearance, no palpable masses or tenderness,  no skin retraction, no nipple inversion, no nipple discharge, no skin discoloration, no axillary or supraclavicular lymphadenopathy Abdomen: no palpable masses or tenderness, no rebound or guarding Extremities: no edema or skin discoloration or tenderness  Pelvic: Vulva: Normal             Vagina: No gross lesions or discharge  Cervix: No gross lesions or discharge.  Pap reflex done.  Uterus  AV, normal size, shape and consistency, non-tender and mobile  Adnexa  Without masses or tenderness  Anus: Normal   Assessment/Plan:  47 y.o. female for annual exam   1. Encounter for routine gynecological examination with Papanicolaou smear of cervix Normal gynecologic exam.  Pap reflex done today.  Breast exam normal.  Schedule screening mammogram now.  Will follow up for health labs. - CBC; Future - Comp Met (CMET); Future - Lipid panel; Future - TSH; Future - Vitamin D 1,25 dihydroxy; Future  2. Use of condoms for contraception Declines alternatives.  Satisfied with strict condom use.  Recommend adding spermicides.  3. Menorrhagia with regular cycle Menorrhagia with regular cycles.  Rule out secondary anemia.  History of hypothyroidism, will check TSH today.  Follow-up pelvic  ultrasound to rule out uterine and especially endometrial pathology such as polyps, submucosal myomas, endometrial hyperplasia and endometrial cancer.  We will decide on treatment based on findings at follow-up. - US Transvaginal Non-OB; Future - CBC; Future - TSH; Future  Counseling on above issues more than 50% for 10 minutes.  Princess Bruins MD, 2:33 PM 01/14/2018

## 2018-01-18 ENCOUNTER — Encounter: Payer: Self-pay | Admitting: Obstetrics & Gynecology

## 2018-01-18 NOTE — Patient Instructions (Signed)
1. Encounter for routine gynecological examination with Papanicolaou smear of cervix Normal gynecologic exam.  Pap reflex done today.  Breast exam normal.  Schedule screening mammogram now.  Will follow up for health labs. - CBC; Future - Comp Met (CMET); Future - Lipid panel; Future - TSH; Future - Vitamin D 1,25 dihydroxy; Future  2. Use of condoms for contraception Declines alternatives.  Satisfied with strict condom use.  Recommend adding spermicides.  3. Menorrhagia with regular cycle Menorrhagia with regular cycles.  Rule out secondary anemia.  History of hypothyroidism, will check TSH today.  Follow-up pelvic ultrasound to rule out uterine and especially endometrial pathology such as polyps, submucosal myomas, endometrial hyperplasia and endometrial cancer.  We will decide on treatment based on findings at follow-up. - US Transvaginal Non-OB; Future - CBC; Future - TSH; Future  Natasha Wells, fue un placer conocerle hoy!  Voy a informarle de sus [employer].  [place] (Health Maintenance, Female) Un estilo de vida saludable y los cuidados preventivos pueden favorecer considerablemente a la salud y Musician. Pregunte a su mdico cul es el cronograma de exmenes peridicos apropiado para usted. Esta es una buena oportunidad para consultarlo sobre cmo prevenir enfermedades y [place] sano. Adems de los controles, hay muchas otras cosas que puede hacer usted mismo. Los expertos han realizado numerosas investigaciones [employer]itor cambios en el estilo de vida y las medidas de prevencin que, [place], lo ayudarn a mantenerse sano. Solicite a su mdico ms informacin. EL PESO Y LA DIETA Consuma una dieta saludable.  Asegrese de [employer] verduras, frutas, productos lcteos de bajo contenido de [place] y Advertising account planner.  No consuma muchos alimentos de alto contenido de grasas slidas, azcares agregados o sal.  Realice actividad  fsica con regularidad. Esta es una de las prcticas ms importantes que puede hacer por su salud. ? La Delorise Shiner de los adultos deben hacer ejercicio durante al menos 166mnutos por semana. El ejercicio debe aumentar la frecuencia cardaca y pActorla transpiracin (ejercicio de i[place]. ? La mayora de los adultos tambin deben hacer ejercicios de elongacin al mToysRusveces a la semana. Agregue esto al su plan de ejercicio de intensidad moderada. Mantenga un peso saludable.  El ndice de masa corporal ([facility] es una medida que puede utilizarse para identificar posibles problemas de pVenice Proporciona una estimacin de la grasa corporal basndose en el peso y la altura. Su mdico puede ayudarle a dRadiation protection practitionerIElmiray a lScientist, forensico mTheatre managerun peso saludable.  Para las mujeres de 20aos o ms: ? Un IBaylor Scott & White Medical Center - Marble Fallsmenor de 18,5 se considera bajo peso. ? Un ISt Lukes Hospital Sacred Heart Campusentre 18,5 y 24,9 es normal. ? Un IAdvanced Ambulatory Surgical Care LPentre 25 y 29,9 se considera sobrepeso. ? Un IMC de 30 o ms se considera obesidad. Observe los niveles de colesterol y lpidos en la sangre.  Debe comenzar a rEnglish as a second language teacherde lpidos y cResearch officer, trade unionen la sangre a los 20aos y luego repetirlos cada 536aos  Es posible que nAutomotive engineerlos niveles de colesterol con mayor frecuencia si: ? Sus niveles de lpidos y colesterol son altos. ? Es mayor de [id number] ? Presenta un alto riesgo de padecer enfermedades cardacas. DETECCIN DE CNCER Cncer de pulmn  Se recomienda realizar exmenes de deteccin de cncer de pulmn a personas adultas entre 561y 87aos que estn en riesgo de dHorticulturist, commercialde pulmn por sus antecedentes de consumo de tabaco.  Se recomienda una tomografa computarizada de baja dosis de  los [employer] aos a las personas que: ? Fuman actualmente. ? Hayan dejado el hbito en algn momento en los ltimos 15aos. ? Hayan fumado durante 30aos un paquete diario. Un paquete-ao equivale a fumar un promedio de  un paquete de cigarrillos diario durante un ao.  Los exmenes de deteccin anuales deben continuar hasta que hayan pasado 15aos desde que dej de fumar.  Ya no debern realizarse si tiene un problema de salud que le impida recibir tratamiento para Science writer de pulmn. Cncer de mama  Practique la autoconciencia de la mama. Esto significa reconocer la apariencia normal de sus mamas y cmo las siente.  Tambin significa realizar autoexmenes regulares de [employer]. Informe a su mdico sobre cualquier cambio, sin importar cun pequeo sea.  Si tiene entre 20 y 52 aos, un mdico debe realizarle un examen clnico de las mamas como parte del examen regular de [place], cada 1 a 3aos.  Si tiene 40aos o ms, debe Information systems manager clnico de las Microsoft. Tambin considere realizarse una [place]) todos los [place].  Si tiene antecedentes familiares de cncer de mama, hable con su mdico para someterse a un estudio gentico.  Si tiene alto riesgo de Chief Financial Officer de mama, hable con su mdico para someterse a Public house manager y [employer].  La evaluacin del gen del cncer de mama (BRCA) se recomienda a mujeres que tengan familiares con cnceres relacionados con el BRCA. Los cnceres relacionados con el BRCA incluyen los siguientes: ? [place]. ? Ovario. ? [place]. ? Cnceres de peritoneo.  Los resultados de la evaluacin determinarn la necesidad de asesoramiento gentico y de [place] de BRCA1 y BRCA2. Cncer de cuello del tero El mdico puede recomendarle que se haga pruebas peridicas de deteccin de cncer de los rganos de la pelvis (ovarios, tero y vagina). Estas pruebas incluyen un examen plvico, que abarca controlar si se produjeron cambios microscpicos en la superficie del cuello del tero (prueba de Papanicolaou). Pueden recomendarle que se haga estas pruebas cada 3aos, a partir de los 21aos.  A las mujeres  que tienen entre 30 y 53aos, los mdicos pueden recomendarles que se sometan a exmenes plvicos y pruebas de Papanicolaou cada 57aos, o a la prueba de Papanicolaou y el examen plvico en combinacin con estudios de deteccin del virus del papiloma humano (VPH) cada 5aos. Algunos tipos de VPH aumentan el riesgo de Chief Financial Officer de cuello del tero. La prueba para la deteccin del VPH tambin puede realizarse a mujeres de cualquier edad cuyos resultados de la prueba de Papanicolaou no sean claros.  Es posible que otros mdicos no recomienden exmenes de deteccin a mujeres no embarazadas que se consideran sujetos de bajo riesgo de Chief Financial Officer de pelvis y que no tienen sntomas. Pregntele al mdico si un examen plvico de deteccin es adecuado para usted.  Si ha recibido un tratamiento para Science writer cervical o una enfermedad que podra causar cncer, necesitar realizarse una prueba de Papanicolaou y controles durante al menos 31 aos de concluido el [place]. Si no se ha hecho el Papanicolaou con regularidad, debern volver a evaluarse los factores de riesgo (como tener un nuevo compaero sexual), para Teacher, adult education si debe realizarse los estudios nuevamente. Algunas mujeres sufren problemas mdicos que aumentan la probabilidad de Museum/gallery curator cncer de cuello del tero. En estos casos, el mdico podr [employer] se realicen controles y pruebas de Papanicolaou con ms frecuencia. [place]  tipo de cncer puede detectarse y a menudo prevenirse.  Por lo general, los estudios de rutina se deben Medical laboratory scientific officer a Field seismologist a Proofreader de los 50 aos y [place] 25 aos.  Sin embargo, el mdico podr aconsejarle que lo haga antes, si tiene factores de riesgo para el cncer de colon.  Tambin puede recomendarle que use un kit de prueba para Hydrologist en la materia fecal.  Es posible que se use una pequea cmara en el extremo de un tubo para examinar directamente el colon (sigmoidoscopia  o colonoscopia) a fin de Hydrographic surveyor formas tempranas de cncer colorrectal.  Los exmenes de rutina generalmente comienzan a los 44aos.  El examen directo del colon se debe repetir cada 5 a 10aos hasta los 75aos. Sin embargo, es posible que se realicen exmenes con mayor frecuencia, si se detectan formas tempranas de plipos precancerosos o pequeos bultos. Cncer de piel  Revise la piel de la cabeza a los pies con regularidad.  Informe a su mdico si aparecen nuevos lunares o los que tiene se modifican, especialmente en su forma y color.  Tambin notifique al mdico si tiene un lunar que es ms grande que el tamao de una goma de lpiz.  Siempre use pantalla solar. Aplique pantalla solar de Kerry Dory y repetida a lo largo del Training and development officer.  Protjase usando mangas y [employer], un sombrero de ala ancha y gafas para el sol, siempre que se encuentre en el exterior. ENFERMEDADES CARDACAS, DIABETES E HIPERTENSIN ARTERIAL  La hipertensin arterial causa enfermedades cardacas y [place] el riesgo de ictus. La hipertensin arterial es ms probable en los siguientes casos: ? Las personas que tienen la presin arterial en el extremo del rango normal (100-139/85-89 mm Hg). ? [employer] con sobrepeso u obesidad. ? Scientist, water quality.  Si usted tiene entre 18 y 39 aos, debe medirse la presin arterial cada 3 a 5 aos. Si usted tiene 40 aos o ms, debe medirse la presin arterial [employer]. Debe medirse la presin arterial dos veces: una vez cuando est en un hospital o una clnica y la otra vez cuando est en otro sitio. Registre el promedio de [employer]. Para controlar su presin arterial cuando no est en un hospital o [place], puede usar lo siguiente: ? Jorje Guild automtica para medir la presin arterial en una farmacia. ? Un monitor para medir la presin arterial en el hogar.  Si tiene entre 57 y 53 aos, consulte a su mdico si debe tomar aspirina para  prevenir el ictus.  Realcese exmenes de deteccin de la diabetes con regularidad. Esto incluye la toma de [place] de sangre para controlar el nivel de azcar en la sangre durante el [place]. ? Si tiene un peso normal y un bajo riesgo de padecer diabetes, realcese este anlisis cada tres aos despus de los 45aos. ? Si tiene sobrepeso y un alto riesgo de padecer diabetes, considere someterse a este anlisis antes o con mayor frecuencia. PREVENCIN DE INFECCIONES HepatitisB  Si tiene un riesgo ms alto de Museum/gallery curator hepatitis B, debe someterse a un examen de deteccin de este virus. Se considera que tiene un alto riesgo de contraer hepatitis B si: ? Naci en un pas donde la hepatitis B es frecuente. Pregntele a su mdico qu pases son considerados de Public affairs consultant. ? Sus padres nacieron en un pas de alto riesgo y usted no recibi una vacuna que lo proteja contra la hepatitis B (vacuna contra la hepatitis B). ?  [place]. ? [place] agujas para inyectarse drogas. ? Vive con alguien que tiene hepatitis B. ? Ha tenido sexo con alguien que tiene hepatitis B. ? Recibe tratamiento de hemodilisis. ? Toma ciertos medicamentos para el cncer, trasplante de rganos y afecciones autoinmunitarias. Hepatitis C  Se recomienda un anlisis de [place] para: ? [employer] 1945 y 1965. ? Todas las personas que tengan un riesgo de haber contrado hepatitis C. Enfermedades de transmisin sexual (ETS).  Debe realizarse pruebas de deteccin de enfermedades de transmisin sexual (ETS), incluidas gonorrea y clamidia si: ? Es sexualmente activo y es menor de 31DVV. ? Es mayor de 24aos, y Investment banker, operational informa que corre riesgo de tener este tipo de infecciones. ? La actividad sexual ha cambiado desde que le hicieron la ltima prueba de deteccin y tiene un riesgo mayor de Best boy clamidia o Radio broadcast assistant. Pregntele al mdico si usted tiene riesgo.  Si no tiene el VIH, pero corre riesgo de  infectarse por el virus, se recomienda tomar diariamente un medicamento recetado para evitar la infeccin. Esto se conoce como profilaxis previa a la exposicin. Se considera que est en riesgo si: ? Es [place] sexualmente y no [place] preservativos habitualmente o no conoce el estado del VIH de sus Advertising copywriter. ? Se inyecta drogas. ? Es [place] sexualmente con Ardelia Mems pareja que tiene VIH. Consulte a su mdico para saber si tiene un alto riesgo de infectarse por el VIH. Si opta por comenzar la profilaxis previa a la exposicin, primero debe realizarse anlisis de deteccin del VIH. Luego, le harn anlisis cada 54mses mientras est tomando los medicamentos para la profilaxis previa a la exposicin. [facility] Si es premenopusica y puede quedar e[place] solicite a su mdico asesoramiento previo a la concepcin.  Si puede quedar embarazada, tome 400 a [id number](mcg) de cido fAnheuser-Busch  Si desea evitar el embarazo, hable con su mdico sobre el control de la natalidad (anticoncepcin). OSTEOPOROSIS Y MENOPAUSIA  La osteoporosis es una enfermedad en la que los huesos pierden los minerales y la fuerza por el avance de la edad. El resultado pueden ser fracturas graves en los hShandon El riesgo de osteoporosis puede identificarse con uArdelia Memsprueba de densidad sea.  Si tiene 65aos o ms, o si est en riesgo de sufrir osteoporosis y fracturas, pregunte a su mdico si debe someterse a exmenes.  Consulte a su mdico si debe tomar un suplemento de calcio o de vitamina D para reducir el riesgo de osteoporosis.  La menopausia puede presentar ciertos sntomas fsicos y rGaffer  La terapia de reemplazo hormonal puede reducir algunos de estos sntomas y rGaffer Consulte a su mdico para saber si la terapia de reemplazo hormonal es conveniente para usted. INSTRUCCIONES PARA EL CUIDADO EN EL HOGAR  Realcese los estudios de rutina de la salud, dentales y de lPublic librarian  MSt. Matthews  No consuma ningn producto que contenga tabaco, lo que incluye cigarrillos, tabaco de mHigher education careers advisero cPsychologist, sport and exercise  Si est embarazada, no beba alcohol.  Si est amamantando, reduzca el consumo de alcohol y la frecuencia con la que consume.  Si es mujer y no est embarazada limite el consumo de alcohol a no ms de 1 medida por da. Una medida equivale a 12onzas de cerveza, 5onzas de vino o 1onzas de bebidas alcohlicas de alta graduacin.  No consuma drogas.  No comparta agujas.  Solicite ayuda a su mdico si necesita apoyo o  informacin para abandonar las drogas.  Informe a su mdico si a menudo se siente deprimido.  Notifique a su mdico si alguna vez ha sido vctima de abuso o si no se siente seguro en su hogar. Esta informacin no tiene Marine scientist el consejo del mdico. Asegrese de hacerle al mdico cualquier pregunta que tenga. Document Released: 11/19/2011 Document Revised: 12/21/2014 Document Reviewed: 09/03/2015 Elsevier Interactive Patient Education  [employer].

## 2018-01-20 LAB — PAP IG W/ RFLX HPV ASCU

## 2018-01-24 ENCOUNTER — Other Ambulatory Visit: Payer: Self-pay | Admitting: Gynecology

## 2018-02-09 ENCOUNTER — Encounter: Payer: Self-pay | Admitting: Obstetrics & Gynecology

## 2018-02-09 ENCOUNTER — Ambulatory Visit (INDEPENDENT_AMBULATORY_CARE_PROVIDER_SITE_OTHER): Payer: 59

## 2018-02-09 ENCOUNTER — Ambulatory Visit (INDEPENDENT_AMBULATORY_CARE_PROVIDER_SITE_OTHER): Payer: 59 | Admitting: Obstetrics & Gynecology

## 2018-02-09 VITALS — BP 122/76

## 2018-02-09 DIAGNOSIS — R9389 Abnormal findings on diagnostic imaging of other specified body structures: Secondary | ICD-10-CM | POA: Diagnosis not present

## 2018-02-09 DIAGNOSIS — N920 Excessive and frequent menstruation with regular cycle: Secondary | ICD-10-CM

## 2018-02-09 DIAGNOSIS — Z01419 Encounter for gynecological examination (general) (routine) without abnormal findings: Secondary | ICD-10-CM

## 2018-02-09 NOTE — Progress Notes (Signed)
    Natasha Wells 09/29/1971 [id number]        47 y.o.  [id number]   RP: Menorrhagia for Pelvic US  HPI: Had a heavy LMP 02/09/2018.  On last visit 01/14/2018 we noted:   Using condoms.  Menses every month lasting 5 days, except the last period 12/14/2017 which lasted 3 weeks.  No pelvic pain.  Normal secretions.  Urine/BMs wnl.  Breasts wnl.  Dr Chalmers Cater following for Hypothyroidism/h/o Hyperparathyroidism.  Will f/u here for Health labs.  Not very physically active.  BMI 37.02.   OB History  Gravida Para Term Preterm AB Living  2 2 2     2   SAB TAB Ectopic Multiple Live Births          2    # Outcome Date GA Lbr Len/2nd Weight Sex Delivery Anes PTL Lv  2 Term     F CS-Unspec  N LIV  1 Term     M CS-Unspec  N LIV      Past medical history,surgical history, problem list, medications, allergies, family history and social history were all reviewed and documented in the EPIC chart.   Directed ROS with pertinent positives and negatives documented in the history of present illness/assessment and plan.  Exam:  There were no vitals filed for this visit. General appearance:  Normal  Pelvic US today: T/V images.  Uterus anteverted and homogeneous measuring 9.18 x 5.84 x 4.62 cm.  Endometrial lining is thickened measuring 14.9 mm with positive color flow Doppler.  Right and left ovaries normal.  No apparent mass in the right and left adnexa.  No free fluid in the posterior cul-de-sac.  Today 02/09/2018: TSH 1.24 normal Hb 12.2 normal   Assessment/Plan:  47 y.o. [id number]   1. Menorrhagia with regular cycle Thickened endometrial lining at 14.9 mm with positive color flow Doppler.  Heavy periods probably associated with an intrauterine lesion.  Will follow up for a sonohysterogram. - [place] Sonohysterogram; Future  2. Thickened endometrium Endometrial lining at 14.9 with positive color flow Doppler.  Will follow up for a sonohysterogram to evaluate for the possibility of endometrial polyps or  submucosal myoma.  If no intrauterine lesion but thick endometrium, will proceed the same day with an endometrial biopsy.  We will decide on management per results.  Procedure discussed with patient. - [place] Sonohysterogram; Future  Counseling on above issues more than 50% for 15 minutes.  Princess Bruins MD, 12:12 PM 02/09/2018

## 2018-02-10 ENCOUNTER — Encounter: Payer: Self-pay | Admitting: Obstetrics & Gynecology

## 2018-02-10 NOTE — Patient Instructions (Signed)
1. Menorrhagia with regular cycle Thickened endometrial lining at 14.9 mm with positive color flow Doppler.  Heavy periods probably associated with an intrauterine lesion.  Will follow up for a sonohysterogram. - [place] Sonohysterogram; Future  2. Thickened endometrium Endometrial lining at 14.9 with positive color flow Doppler.  Will follow up for a sonohysterogram to evaluate for the possibility of endometrial polyps or submucosal myoma.  If no intrauterine lesion but thick endometrium, will proceed the same day with an endometrial biopsy.  We will decide on management per results.  Procedure discussed with patient. - [place] Sonohysterogram; Future  Natasha Wells, un placer verle hoy!  Histerosonografa (Sonohysterogram) La histerosonografa es un procedimiento que examina el interior del tero. En este examen, se envan ondas sonoras a una computadora para tomar imgenes en tiempo real del interior del tero. Para obtener las mejores imgenes, se inyecta una solucin salina sin grmenes (solucin salina estril) dentro del tero a travs de la vagina. La histerosonografa puede mostrar si hay cicatrices o tumores anormales dentro del tero. Tambin puede mostrar si el tero tiene forma anormal o si su revestimiento es muy delgado. INFORME A SU MDICO:  Cualquier alergia que tenga.  Todos los [employer] [place], incluyendo vitaminas, hierbas, gotas oftlmicas, cremas y medicamentos de venta libre.  Problemas previos que usted o los [employer] de su familia hayan tenido con el uso de anestsicos.  Enfermedades de la sangre que tenga.  Cirugas previas.  Enfermedades que tenga.  La fecha de su ltima menstruacin.  Probabilidad de embarazo.  RIESGOS Y COMPLICACIONES En general, la histerosonografa es un procedimiento seguro. Sin embargo, Engineer, technical sales, pueden surgir problemas. Algunos posibles problemas incluyen:  Hemorragia.  Infeccin. ANTES DEL [place] podr indicarle que tome un analgsico de [place].  Quizs le receten un antibitico.  Es posible que el mdico le haga una prueba de embarazo antes del procedimiento.  Deber vaciar la vejiga.  PROCEDIMIENTO  Deber [name] en una camilla con las rodillas levantadas o los pies apoyados en estribos.  El mdico puede hacerle un examen plvico antes de comenzar el procedimiento.  Le colocarn en la vagina un dispositivo manual delgado (transductor), que previamente se lubricar.  El transductor se [place] de manera tal que enve ondas sonoras al tero. ? Estas ondas sonoras rebotarn en el transductor y se enviarn a una computadora. ? La computadora convertir las ondas sonoras en imgenes en vivo. ? El mdico ver estas imgenes en una pantalla durante el procedimiento.  El mdico sacar el transductor de la vagina y usar un instrumento para ensanchar la apertura vaginal (espculo).  Se utilizar un hisopo para limpiar la apertura del tero (cuello del tero).  Luego se colocar un tubo delgado y flexible (catter) a travs del cuello del tero hasta el tero.  El mdico llenar el tero con solucin salina a travs del catter. Puede sentir algunos clicos.  Le sacarn el espculo.  Le colocarn de nuevo el transductor en la vagina para tomar ms imgenes.  DESPUS DEL PROCEDIMIENTO Despus del procedimiento, es habitual tener sangrado leve de la vagina, clicos y flujo acuoso. Esta informacin no tiene Marine scientist el consejo del mdico. Asegrese de hacerle al mdico cualquier pregunta que tenga. Document Released: 04/16/2014 Document Revised: 04/16/2014 Document Reviewed: 11/13/2013 Elsevier Interactive Patient Education  2017 [employer].

## 2018-02-12 LAB — TSH: TSH: 1.24 m[IU]/L

## 2018-02-12 LAB — LIPID PANEL
CHOL/HDL RATIO: 3.9 (calc) (ref <5.0)
Cholesterol: 171 mg/dL (ref <200)
HDL: 44 mg/dL — ABNORMAL LOW (ref >50)
LDL CHOLESTEROL (CALC): 104 mg/dL — AB
Non-HDL Cholesterol (Calc): 127 mg/dL (calc) (ref <130)
Triglycerides: 132 mg/dL (ref <150)

## 2018-02-12 LAB — CBC
HCT: 36.9 % (ref 35.0–45.0)
HEMOGLOBIN: 12.2 g/dL (ref 11.7–15.5)
MCH: 26.9 pg — AB (ref 27.0–33.0)
MCHC: 33.1 g/dL (ref 32.0–36.0)
MCV: 81.3 fL (ref 80.0–100.0)
MPV: 10.4 fL (ref 7.5–12.5)
Platelets: 334 10*3/uL (ref 140–400)
RBC: 4.54 10*6/uL (ref 3.80–5.10)
RDW: 14.2 % (ref 11.0–15.0)
WBC: 7 10*3/uL (ref 3.8–10.8)

## 2018-02-12 LAB — COMPREHENSIVE METABOLIC PANEL
AG Ratio: 1.4 (calc) (ref 1.0–2.5)
ALT: 12 U/L (ref 6–29)
AST: 13 U/L (ref 10–35)
Albumin: 4 g/dL (ref 3.6–5.1)
Alkaline phosphatase (APISO): 56 U/L (ref 33–115)
BUN: 12 mg/dL (ref 7–25)
CO2: 26 mmol/L (ref 20–32)
CREATININE: 0.82 mg/dL (ref 0.50–1.10)
Calcium: 9.2 mg/dL (ref 8.6–10.2)
Chloride: 105 mmol/L (ref 98–110)
GLUCOSE: 76 mg/dL (ref 65–99)
Globulin: 2.9 g/dL (calc) (ref 1.9–3.7)
Potassium: 4.4 mmol/L (ref 3.5–5.3)
SODIUM: 139 mmol/L (ref 135–146)
Total Bilirubin: 0.3 mg/dL (ref 0.2–1.2)
Total Protein: 6.9 g/dL (ref 6.1–8.1)

## 2018-02-12 LAB — VITAMIN D 1,25 DIHYDROXY
VITAMIN D 1, 25 (OH) TOTAL: 27 pg/mL (ref 18–72)
VITAMIN D3 1, 25 (OH): 27 pg/mL
Vitamin D2 1, 25 (OH)2: <8 pg/mL

## 2018-03-04 ENCOUNTER — Other Ambulatory Visit: Payer: Self-pay | Admitting: Obstetrics & Gynecology

## 2018-03-04 DIAGNOSIS — Z1231 Encounter for screening mammogram for malignant neoplasm of breast: Secondary | ICD-10-CM

## 2018-03-07 ENCOUNTER — Ambulatory Visit (INDEPENDENT_AMBULATORY_CARE_PROVIDER_SITE_OTHER): Payer: 59

## 2018-03-07 ENCOUNTER — Ambulatory Visit: Payer: 59 | Admitting: Obstetrics & Gynecology

## 2018-03-07 ENCOUNTER — Other Ambulatory Visit: Payer: 59

## 2018-03-07 ENCOUNTER — Encounter: Payer: Self-pay | Admitting: Obstetrics & Gynecology

## 2018-03-07 ENCOUNTER — Ambulatory Visit (INDEPENDENT_AMBULATORY_CARE_PROVIDER_SITE_OTHER): Payer: 59 | Admitting: Obstetrics & Gynecology

## 2018-03-07 DIAGNOSIS — N939 Abnormal uterine and vaginal bleeding, unspecified: Secondary | ICD-10-CM | POA: Diagnosis not present

## 2018-03-07 DIAGNOSIS — N841 Polyp of cervix uteri: Secondary | ICD-10-CM

## 2018-03-07 DIAGNOSIS — R9389 Abnormal findings on diagnostic imaging of other specified body structures: Secondary | ICD-10-CM

## 2018-03-07 DIAGNOSIS — N920 Excessive and frequent menstruation with regular cycle: Secondary | ICD-10-CM

## 2018-03-07 NOTE — Patient Instructions (Signed)
1. Thickened endometrium Pelvic ultrasound and sonohysterogram after her menstrual period, show a thinner normal tri-layered endometrial lining measuring 11.5 mm.  On sonohysterogram no endometrial defect is seen.  A small focus is present in the endocervical canal measuring 9 x 11 mm at the right posterior aspect with a feeder vessel seen, which is compatible with an endocervical polyp.  2. Endocervical polyp Given the small endocervical lesion compatible with an endocervical polyp present, an endocervical curettage was done.  The specimen was sent to pathology.  Even if the endocervical polyp was not completely removed, if the patient remains asymptomatic and the Endo cervical curettage is benign, we will observe.  Ja, fue un placer verle de nuevo!  Voy a informarle de sus [employer].

## 2018-03-07 NOTE — Progress Notes (Signed)
Natasha Wells 07-11-1971 [id number]        47 y.o.  [id number] Married  RP: Thickened endometrium with pos CFD for Sonohystero  HPI: Normal LMP on 02/26/2018.  No abnormal bleeding.  No pelvic pain.  Uses condoms for contraception.   OB History  Gravida Para Term Preterm AB Living  2 2 2     2   SAB TAB Ectopic Multiple Live Births          2    # Outcome Date GA Lbr Len/2nd Weight Sex Delivery Anes PTL Lv  2 Term     F CS-Unspec  N LIV  1 Term     M CS-Unspec  N LIV    Past medical history,surgical history, problem list, medications, allergies, family history and social history were all reviewed and documented in the EPIC chart.   Directed ROS with pertinent positives and negatives documented in the history of present illness/assessment and plan.  Exam:  There were no vitals filed for this visit. General appearance:  Normal                                                                    Sono Infusion Hysterogram ( procedure note)   The initial transvaginal ultrasound demonstrated the following: T/V images.  Uterus anteverted and homogeneous measuring 9.24 x 6.11 x 4.26 cm.  Endometrial line normal tri-layered and measuring 11.5 mm.  Cervical focus seen with positive color flow Doppler measuring 8 x 9 mm.  Right and left ovaries normal.  No apparent mass in the right or left adnexa.  No free fluid in the posterior cul-de-sac.   The speculum  was inserted and the cervix cleansed with Betadine solution after confirming that patient has no allergies.A small sonohysterography catheterwas utilized.  Insertion was facilitated with ring forceps, using a spear-like motion the catheter was inserted to the fundus of the uterus. The speculum is then removed carefully to avoid dislodging the catheter. The catheter was flushed with sterile saline delete prior to insertion to rid it of small amounts of air.the sterile saline solution was infused into the uterine cavity as a vaginal  ultrasound probe was then placed in the vagina for full visualization of the uterine cavity from a transvaginal approach. The following was noted: Following injection of saline the endometrium feels with no defect seen in the intrauterine cavity.  A small focus is seen in the endocervical canal measuring 9 x 11 mm at the right posterior aspect with a feeder vessel seen.   The catheter was then removed after retrieving some of the saline from the intrauterine cavity. An endometrial biopsy was not done.  But an ECC was done because of the probable endocervical polyp present and the specimen sent to pathology.  Patient tolerated procedure well. She had received a tablet of Aleve for discomfort.    Assessment/Plan:  47 y.o. G2P2002   1. Thickened endometrium Pelvic ultrasound and sonohysterogram after her menstrual period, show a thinner normal tri-layered endometrial lining measuring 11.5 mm.  On sonohysterogram no endometrial defect is seen.  A small focus is present in the endocervical canal measuring 9 x 11 mm at the right posterior aspect with a feeder vessel seen, which is compatible with an  endocervical polyp.  2. Endocervical polyp Given the small endocervical lesion compatible with an endocervical polyp present, an endocervical curettage was done.  The specimen was sent to pathology.  Even if the endocervical polyp was not completely removed, if the patient remains asymptomatic and the Endo cervical curettage is benign, we will observe.  Counseling and coordination of care on above issues more than 50% of 15 minutes.  Princess Bruins MD, 11:51 AM 03/07/2018

## 2018-03-28 ENCOUNTER — Ambulatory Visit
Admission: RE | Admit: 2018-03-28 | Discharge: 2018-03-28 | Disposition: A | Discharge status: Home / Self Care | Payer: 59 | Source: Ambulatory Visit | Attending: Obstetrics & Gynecology | Admitting: Obstetrics & Gynecology

## 2018-03-28 DIAGNOSIS — Z1231 Encounter for screening mammogram for malignant neoplasm of breast: Secondary | ICD-10-CM

## 2020-02-21 ENCOUNTER — Telehealth: Payer: Self-pay | Admitting: *Deleted

## 2020-02-21 NOTE — Telephone Encounter (Signed)
Left message on machine for patient to see if she would like to reschedule to an earlier date.

## 2020-03-01 ENCOUNTER — Encounter: Payer: Self-pay | Admitting: Internal Medicine

## 2020-03-01 ENCOUNTER — Encounter: Payer: Self-pay | Admitting: Gastroenterology

## 2020-03-01 ENCOUNTER — Other Ambulatory Visit: Payer: Self-pay

## 2020-03-01 ENCOUNTER — Ambulatory Visit (INDEPENDENT_AMBULATORY_CARE_PROVIDER_SITE_OTHER): Payer: 59 | Admitting: Internal Medicine

## 2020-03-01 VITALS — BP 120/74 | HR 70 | Temp 97.5°F | Ht 60.0 in | Wt 177.8 lb

## 2020-03-01 DIAGNOSIS — E559 Vitamin D deficiency, unspecified: Secondary | ICD-10-CM | POA: Diagnosis not present

## 2020-03-01 DIAGNOSIS — E669 Obesity, unspecified: Secondary | ICD-10-CM

## 2020-03-01 DIAGNOSIS — Z23 Encounter for immunization: Secondary | ICD-10-CM | POA: Diagnosis not present

## 2020-03-01 DIAGNOSIS — E039 Hypothyroidism, unspecified: Secondary | ICD-10-CM | POA: Diagnosis not present

## 2020-03-01 DIAGNOSIS — Z1211 Encounter for screening for malignant neoplasm of colon: Secondary | ICD-10-CM | POA: Diagnosis not present

## 2020-03-01 DIAGNOSIS — Z0000 Encounter for general adult medical examination without abnormal findings: Secondary | ICD-10-CM | POA: Diagnosis not present

## 2020-03-01 DIAGNOSIS — Z1231 Encounter for screening mammogram for malignant neoplasm of breast: Secondary | ICD-10-CM

## 2020-03-01 DIAGNOSIS — K921 Melena: Secondary | ICD-10-CM

## 2020-03-01 LAB — LIPID PANEL
Cholesterol: 153 mg/dL (ref 0–200)
HDL: 47.1 mg/dL (ref >39.00)
LDL Cholesterol: 93 mg/dL (ref 0–99)
NonHDL: 106.3
Total CHOL/HDL Ratio: 3
Triglycerides: 67 mg/dL (ref 0.0–149.0)
VLDL: 13.4 mg/dL (ref 0.0–40.0)

## 2020-03-01 LAB — CBC WITH DIFFERENTIAL/PLATELET
Basophils Absolute: 0 10*3/uL (ref 0.0–0.1)
Basophils Relative: 0.6 % (ref 0.0–3.0)
Eosinophils Absolute: 0.1 10*3/uL (ref 0.0–0.7)
Eosinophils Relative: 2.2 % (ref 0.0–5.0)
HCT: 33.3 % — ABNORMAL LOW (ref 36.0–46.0)
Hemoglobin: 10.7 g/dL — ABNORMAL LOW (ref 12.0–15.0)
Lymphocytes Relative: 33.2 % (ref 12.0–46.0)
Lymphs Abs: 1.5 10*3/uL (ref 0.7–4.0)
MCHC: 32.2 g/dL (ref 30.0–36.0)
MCV: 73.1 fl — ABNORMAL LOW (ref 78.0–100.0)
Monocytes Absolute: 0.3 10*3/uL (ref 0.1–1.0)
Monocytes Relative: 7.3 % (ref 3.0–12.0)
Neutro Abs: 2.6 10*3/uL (ref 1.4–7.7)
Neutrophils Relative %: 56.7 % (ref 43.0–77.0)
Platelets: 346 10*3/uL (ref 150.0–400.0)
RBC: 4.55 Mil/uL (ref 3.87–5.11)
RDW: 16.5 % — ABNORMAL HIGH (ref 11.5–15.5)
WBC: 4.6 10*3/uL (ref 4.0–10.5)

## 2020-03-01 LAB — COMPREHENSIVE METABOLIC PANEL
ALT: 14 U/L (ref 0–35)
AST: 14 U/L (ref 0–37)
Albumin: 3.9 g/dL (ref 3.5–5.2)
Alkaline Phosphatase: 62 U/L (ref 39–117)
BUN: 14 mg/dL (ref 6–23)
CO2: 29 mEq/L (ref 19–32)
Calcium: 9 mg/dL (ref 8.4–10.5)
Chloride: 104 mEq/L (ref 96–112)
Creatinine, Ser: 0.76 mg/dL (ref 0.40–1.20)
GFR: 81.23 mL/min (ref >60.00)
Glucose, Bld: 93 mg/dL (ref 70–99)
Potassium: 5.3 mEq/L — ABNORMAL HIGH (ref 3.5–5.1)
Sodium: 137 mEq/L (ref 135–145)
Total Bilirubin: 0.3 mg/dL (ref 0.2–1.2)
Total Protein: 7 g/dL (ref 6.0–8.3)

## 2020-03-01 LAB — VITAMIN B12: Vitamin B-12: 247 pg/mL (ref 211–911)

## 2020-03-01 LAB — HEMOGLOBIN A1C: Hgb A1c MFr Bld: 5.5 % (ref 4.6–6.5)

## 2020-03-01 LAB — TSH: TSH: 1.23 u[IU]/mL (ref 0.35–4.50)

## 2020-03-01 LAB — VITAMIN D 25 HYDROXY (VIT D DEFICIENCY, FRACTURES): VITD: 31.24 ng/mL (ref 30.00–100.00)

## 2020-03-01 NOTE — Addendum Note (Signed)
Addended by: Westley Hummer B on: 03/01/2020 04:08 PM   Modules accepted: Orders

## 2020-03-01 NOTE — Patient Instructions (Signed)
-Nice seeing you today!!  -Lab work today; will notify you once results are available.  -Mammogram and GI referrals for colonoscopy today.  -Tetanus vaccine today.  -See you back in 1 year or sooner as needed.   Preventive Care 48-48 Years Old, Female Preventive care refers to visits with your health care provider and lifestyle choices that can promote health and wellness. This includes:  A yearly physical exam. This may also be called an annual well check.  Regular dental visits and eye exams.  Immunizations.  Screening for certain conditions.  Healthy lifestyle choices, such as eating a healthy diet, getting regular exercise, not using drugs or products that contain nicotine and tobacco, and limiting alcohol use. What can I expect for my preventive care visit? Physical exam Your health care provider will check your:  Height and weight. This may be used to calculate body mass index (BMI), which tells if you are at a healthy weight.  Heart rate and blood pressure.  Skin for abnormal spots. Counseling Your health care provider may ask you questions about your:  Alcohol, tobacco, and drug use.  Emotional well-being.  Home and relationship well-being.  Sexual activity.  Eating habits.  Work and work Statistician.  Method of birth control.  Menstrual cycle.  Pregnancy history. What immunizations do I need?  Influenza (flu) vaccine  This is recommended every year. Tetanus, diphtheria, and pertussis (Tdap) vaccine  You may need a Td booster every 10 years. Varicella (chickenpox) vaccine  You may need this if you have not been vaccinated. Zoster (shingles) vaccine  You may need this after age 48. Measles, mumps, and rubella (MMR) vaccine  You may need at least one dose of MMR if you were born in 1957 or later. You may also need a second dose. Pneumococcal conjugate (PCV13) vaccine  You may need this if you have certain conditions and were not previously  vaccinated. Pneumococcal polysaccharide (PPSV23) vaccine  You may need one or two doses if you smoke cigarettes or if you have certain conditions. Meningococcal conjugate (MenACWY) vaccine  You may need this if you have certain conditions. Hepatitis A vaccine  You may need this if you have certain conditions or if you travel or work in places where you may be exposed to hepatitis A. Hepatitis B vaccine  You may need this if you have certain conditions or if you travel or work in places where you may be exposed to hepatitis B. Haemophilus influenzae type b (Hib) vaccine  You may need this if you have certain conditions. Human papillomavirus (HPV) vaccine  If recommended by your health care provider, you may need three doses over 6 months. You may receive vaccines as individual doses or as more than one vaccine together in one shot (combination vaccines). Talk with your health care provider about the risks and benefits of combination vaccines. What tests do I need? Blood tests  Lipid and cholesterol levels. These may be checked every 5 years, or more frequently if you are over 12 years old.  Hepatitis C test.  Hepatitis B test. Screening  Lung cancer screening. You may have this screening every year starting at age 48 if you have a 30-pack-year history of smoking and currently smoke or have quit within the past 15 years.  Colorectal cancer screening. All adults should have this screening starting at age 48 and continuing until age 17. Your health care provider may recommend screening at age 5 if you are at increased risk. You will  have tests every 1-10 years, depending on your results and the type of screening test.  Diabetes screening. This is done by checking your blood sugar (glucose) after you have not eaten for a while (fasting). You may have this done every 1-3 years.  Mammogram. This may be done every 1-2 years. Talk with your health care provider about when you should start  having regular mammograms. This may depend on whether you have a family history of breast cancer.  BRCA-related cancer screening. This may be done if you have a family history of breast, ovarian, tubal, or peritoneal cancers.  Pelvic exam and Pap test. This may be done every 3 years starting at age 48. Starting at age 73, this may be done every 5 years if you have a Pap test in combination with an HPV test. Other tests  Sexually transmitted disease (STD) testing.  Bone density scan. This is done to screen for osteoporosis. You may have this scan if you are at high risk for osteoporosis. Follow these instructions at home: Eating and drinking  Eat a diet that includes fresh fruits and vegetables, whole grains, lean protein, and low-fat dairy.  Take vitamin and mineral supplements as recommended by your health care provider.  Do not drink alcohol if: ? Your health care provider tells you not to drink. ? You are pregnant, may be pregnant, or are planning to become pregnant.  If you drink alcohol: ? Limit how much you have to 0-1 drink a day. ? Be aware of how much alcohol is in your drink. In the [place] one drink equals one 12 oz bottle of beer (355 mL), one 5 oz glass of wine (148 mL), or one 1 oz glass of hard liquor (44 mL). Lifestyle  Take daily care of your teeth and gums.  Stay active. Exercise for at least 30 minutes on 5 or more days each week.  Do not use any products that contain nicotine or tobacco, such as cigarettes, e-cigarettes, and chewing tobacco. If you need help quitting, ask your health care provider.  If you are sexually active, practice safe sex. Use a condom or other form of birth control (contraception) in order to prevent pregnancy and STIs (sexually transmitted infections).  If told by your health care provider, take low-dose aspirin daily starting at age 80. What's next?  Visit your health care provider once a year for a well check visit.  Ask your health  care provider how often you should have your eyes and teeth checked.  Stay up to date on all vaccines. This information is not intended to replace advice given to you by your health care provider. Make sure you discuss any questions you have with your health care provider. Document Revised: 08/11/2018 Document Reviewed: 08/11/2018 Elsevier Patient Education  2020 [employer].

## 2020-03-01 NOTE — Progress Notes (Signed)
New Patient Office Visit     This visit occurred during the SARS-CoV-2 public health emergency.  Safety protocols were in place, including screening questions prior to the visit, additional usage of staff PPE, and extensive cleaning of exam room while observing appropriate contact time as indicated for disinfecting solutions.    CC/Reason for Visit: Establish care, annual preventive exam Previous PCP: None Last Visit: Unknown  HPI: Natasha Wells is a 49 y.o. female who is coming in today for the above mentioned reasons. Past Medical History is significant for: Vitamin D deficiency, obesity with a BMI of 34, hypothyroidism on 50 mcg of Synthroid followed by endocrinology, Dr. Chalmers Cater.  She has been constipated and has noted some blood in her stools, mainly streaks of red blood on the toilet paper.  Other than that she has no complaints.  She has 2 children ages 6 and 7, she prefers to speak in [place].  She does not smoke, she does not drink alcohol, she has no known drug allergies, she has no family history of significance.  She has routine eye and dental care.she last had a Pap smear with GYN in 2019.  She is due for tetanus vaccine, she is due for mammogram.   Past Medical/Surgical History: Past Medical History:  Diagnosis Date  . Allergic rhinitis   . Birth control    condoms  . Vitamin D deficiency     Past Surgical History:  Procedure Laterality Date  . CESAREAN SECTION     x 2    Social History:  reports that she has never smoked. She has never used smokeless tobacco. She reports that she does not drink alcohol or use drugs.  Allergies: No Known Allergies  Family History:  Family History  Problem Relation Age of Onset  . Lung cancer Brother   . Cancer Brother        lung cancer  . Leukemia Other        nephew  . Cancer Other        nephew, leukemia   . Hypertension Neg Hx   . Stroke Neg Hx      Current Outpatient Medications:  .  Ascorbic Acid  (VITAMIN C) 100 MG tablet, Take 100 mg by mouth daily., Disp: , Rfl:  .  cholecalciferol (VITAMIN D3) 25 MCG (1000 UNIT) tablet, Take 1,000 Units by mouth daily., Disp: , Rfl:  .  levothyroxine (SYNTHROID, LEVOTHROID) 50 MCG tablet, Take 1 tablet (50 mcg total) by mouth daily., Disp: 30 tablet, Rfl: 11  Review of Systems:  Constitutional: Denies fever, chills, diaphoresis, appetite change and fatigue.  HEENT: Denies photophobia, eye pain, redness, hearing loss, ear pain, congestion, sore throat, rhinorrhea, sneezing, mouth sores, trouble swallowing, neck pain, neck stiffness and tinnitus.   Respiratory: Denies SOB, DOE, cough, chest tightness,  and wheezing.   Cardiovascular: Denies chest pain, palpitations and leg swelling.  Gastrointestinal: Denies nausea, vomiting, abdominal pain, diarrhea, and abdominal distention.  Genitourinary: Denies dysuria, urgency, frequency, hematuria, flank pain and difficulty urinating.  Endocrine: Denies: hot or cold intolerance, sweats, changes in hair or nails, polyuria, polydipsia. Musculoskeletal: Denies myalgias, back pain, joint swelling, arthralgias and gait problem.  Skin: Denies pallor, rash and wound.  Neurological: Denies dizziness, seizures, syncope, weakness, light-headedness, numbness and headaches.  Hematological: Denies adenopathy. Easy bruising, personal or family bleeding history  Psychiatric/Behavioral: Denies suicidal ideation, mood changes, confusion, nervousness, sleep disturbance and agitation    Physical Exam: Vitals:   03/01/20 0921  BP:  120/74  Pulse: 70  Temp: (!) 97.5 F (36.4 C)  TempSrc: Temporal  SpO2: 97%  Weight: 177 lb 12.8 oz (80.6 kg)  Height: 5' (1.524 m)   Body mass index is 34.72 kg/m.  Constitutional: NAD, calm, comfortable Eyes: PERRL, lids and conjunctivae normal ENMT: Mucous membranes are moist.Tympanic membrane is pearly white, no erythema or bulging. Neck: normal, supple, no masses, no  thyromegaly Respiratory: clear to auscultation bilaterally, no wheezing, no crackles. Normal respiratory effort. No accessory muscle use.  Cardiovascular: Regular rate and rhythm, no murmurs / rubs / gallops. No extremity edema. 2+ pedal pulses. No carotid bruits.  Abdomen: no tenderness, no masses palpated. No hepatosplenomegaly. Bowel sounds positive.  Musculoskeletal: no clubbing / cyanosis. No joint deformity upper and lower extremities. Good ROM, no contractures. Normal muscle tone.  Skin: no rashes, lesions, ulcers. No induration Neurologic: CN 2-12 grossly intact. Sensation intact, DTR normal. Strength 5/5 in all 4.  Psychiatric: Normal judgment and insight. Alert and oriented x 3. Normal mood.    Impression and Plan:  Encounter for preventive health examination  -She has routine eye and dental care. -She is due for tetanus vaccine which she agrees to receive today, otherwise immunizations are up-to-date. -Healthy lifestyle has been discussed in detail. -Screening labs today. -Mammogram is overdue, will order, was normal in 2019. -She is overdue for initial screening colonoscopy, will initiate GI referral. -She had Pap that was normal in 2019 with GYN, she prefers to continue Paps with me, will next be due in 2022.  Encounter for screening mammogram for malignant neoplasm of breast  - Plan: MM Digital Screening  Screening for malignant neoplasm of colon  - Plan: Ambulatory referral to Gastroenterology  Vitamin D deficiency  - Plan: VITAMIN D 25 Hydroxy (Vit-D Deficiency, Fractures)  Hypothyroidism, unspecified type  - Plan: TSH -She follows with endocrinology.  Obesity (BMI 30.0-34.9) -Discussed healthy lifestyle, including increased physical activity and better food choices to promote weight loss.  Hematochezia -Suspect possibly internal hemorrhoids with constipation. -Have advised to start stool softeneres. -She will be referred to GI anyways as she is due for  screening colonoscopy, CBC today.     Patient Instructions  -Nice seeing you today!!  -Lab work today; will notify you once results are available.  -Mammogram and GI referrals for colonoscopy today.  -Tetanus vaccine today.  -See you back in 1 year or sooner as needed.   Preventive Care 7-34 Years Old, Female Preventive care refers to visits with your health care provider and lifestyle choices that can promote health and wellness. This includes:  A yearly physical exam. This may also be called an annual well check.  Regular dental visits and eye exams.  Immunizations.  Screening for certain conditions.  Healthy lifestyle choices, such as eating a healthy diet, getting regular exercise, not using drugs or products that contain nicotine and tobacco, and limiting alcohol use. What can I expect for my preventive care visit? Physical exam Your health care provider will check your:  Height and weight. This may be used to calculate body mass index (BMI), which tells if you are at a healthy weight.  Heart rate and blood pressure.  Skin for abnormal spots. Counseling Your health care provider may ask you questions about your:  Alcohol, tobacco, and drug use.  Emotional well-being.  Home and relationship well-being.  Sexual activity.  Eating habits.  Work and work Statistician.  Method of birth control.  Menstrual cycle.  Pregnancy history. What immunizations do  I need?  Influenza (flu) vaccine  This is recommended every year. Tetanus, diphtheria, and pertussis (Tdap) vaccine  You may need a Td booster every 10 years. Varicella (chickenpox) vaccine  You may need this if you have not been vaccinated. Zoster (shingles) vaccine  You may need this after age 22. Measles, mumps, and rubella (MMR) vaccine  You may need at least one dose of MMR if you were born in 1957 or later. You may also need a second dose. Pneumococcal conjugate (PCV13) vaccine  You may  need this if you have certain conditions and were not previously vaccinated. Pneumococcal polysaccharide (PPSV23) vaccine  You may need one or two doses if you smoke cigarettes or if you have certain conditions. Meningococcal conjugate (MenACWY) vaccine  You may need this if you have certain conditions. Hepatitis A vaccine  You may need this if you have certain conditions or if you travel or work in places where you may be exposed to hepatitis A. Hepatitis B vaccine  You may need this if you have certain conditions or if you travel or work in places where you may be exposed to hepatitis B. Haemophilus influenzae type b (Hib) vaccine  You may need this if you have certain conditions. Human papillomavirus (HPV) vaccine  If recommended by your health care provider, you may need three doses over 6 months. You may receive vaccines as individual doses or as more than one vaccine together in one shot (combination vaccines). Talk with your health care provider about the risks and benefits of combination vaccines. What tests do I need? Blood tests  Lipid and cholesterol levels. These may be checked every 5 years, or more frequently if you are over 32 years old.  Hepatitis C test.  Hepatitis B test. Screening  Lung cancer screening. You may have this screening every year starting at age 21 if you have a 30-pack-year history of smoking and currently smoke or have quit within the past 15 years.  Colorectal cancer screening. All adults should have this screening starting at age 4 and continuing until age 88. Your health care provider may recommend screening at age 68 if you are at increased risk. You will have tests every 1-10 years, depending on your results and the type of screening test.  Diabetes screening. This is done by checking your blood sugar (glucose) after you have not eaten for a while (fasting). You may have this done every 1-3 years.  Mammogram. This may be done every 1-2  years. Talk with your health care provider about when you should start having regular mammograms. This may depend on whether you have a family history of breast cancer.  BRCA-related cancer screening. This may be done if you have a family history of breast, ovarian, tubal, or peritoneal cancers.  Pelvic exam and Pap test. This may be done every 3 years starting at age 70. Starting at age 47, this may be done every 5 years if you have a Pap test in combination with an HPV test. Other tests  Sexually transmitted disease (STD) testing.  Bone density scan. This is done to screen for osteoporosis. You may have this scan if you are at high risk for osteoporosis. Follow these instructions at home: Eating and drinking  Eat a diet that includes fresh fruits and vegetables, whole grains, lean protein, and low-fat dairy.  Take vitamin and mineral supplements as recommended by your health care provider.  Do not drink alcohol if: ? Your health care provider tells  you not to drink. ? You are pregnant, may be pregnant, or are planning to become pregnant.  If you drink alcohol: ? Limit how much you have to 0-1 drink a day. ? Be aware of how much alcohol is in your drink. In the [place] one drink equals one 12 oz bottle of beer (355 mL), one 5 oz glass of wine (148 mL), or one 1 oz glass of hard liquor (44 mL). Lifestyle  Take daily care of your teeth and gums.  Stay active. Exercise for at least 30 minutes on 5 or more days each week.  Do not use any products that contain nicotine or tobacco, such as cigarettes, e-cigarettes, and chewing tobacco. If you need help quitting, ask your health care provider.  If you are sexually active, practice safe sex. Use a condom or other form of birth control (contraception) in order to prevent pregnancy and STIs (sexually transmitted infections).  If told by your health care provider, take low-dose aspirin daily starting at age 56. What's next?  Visit your  health care provider once a year for a well check visit.  Ask your health care provider how often you should have your eyes and teeth checked.  Stay up to date on all vaccines. This information is not intended to replace advice given to you by your health care provider. Make sure you discuss any questions you have with your health care provider. Document Revised: 08/11/2018 Document Reviewed: 08/11/2018 Elsevier Patient Education  2020 [place], [place] [facility]

## 2020-03-01 NOTE — Progress Notes (Signed)
a 

## 2020-03-01 NOTE — Addendum Note (Signed)
Addended by: Elmer Picker on: 03/01/2020 09:52 AM   Modules accepted: Orders

## 2020-03-15 ENCOUNTER — Other Ambulatory Visit: Payer: Self-pay

## 2020-03-15 ENCOUNTER — Ambulatory Visit
Admission: RE | Admit: 2020-03-15 | Discharge: 2020-03-15 | Disposition: A | Discharge status: Home / Self Care | Payer: 59 | Source: Ambulatory Visit | Attending: Internal Medicine | Admitting: Internal Medicine

## 2020-03-15 DIAGNOSIS — Z1231 Encounter for screening mammogram for malignant neoplasm of breast: Secondary | ICD-10-CM

## 2020-04-08 ENCOUNTER — Encounter: Payer: Self-pay | Admitting: Gastroenterology

## 2020-04-08 ENCOUNTER — Ambulatory Visit (INDEPENDENT_AMBULATORY_CARE_PROVIDER_SITE_OTHER): Payer: 59 | Admitting: Gastroenterology

## 2020-04-08 VITALS — BP 114/72 | HR 78 | Temp 97.5°F | Ht 60.0 in | Wt 184.6 lb

## 2020-04-08 DIAGNOSIS — K921 Melena: Secondary | ICD-10-CM | POA: Diagnosis not present

## 2020-04-08 DIAGNOSIS — R194 Change in bowel habit: Secondary | ICD-10-CM

## 2020-04-08 MED ORDER — NA SULFATE-K SULFATE-MG SULF 17.5-3.13-1.6 GM/177ML PO SOLN: 1.0000 | Freq: Once | ORAL | 0 refills | Status: AC

## 2020-04-08 NOTE — Progress Notes (Signed)
History of Present Illness: This is a 49 year old female referred by Natasha Wells, [place] [facility]* MD for the evaluation of change in bowel habits with constipation alternating frequent movements, small volume hematochezia, intermittent lower abdominal pain, gas for about 1 year. Spanish interpreter used for entire visit. Has BM every time she eats except when she is having episodes of constipation.  She was advised to take Colace by her PCP which has reduce the frequency of her constipation.  She notes small-volume hematochezia when straining with hard bowel movements.  She notes increased intestinal gas and bloating.  She occasionally notes mild left lower quadrant and right lower quadrant abdominal pain.  Denies weight loss, change in stool caliber, melena, hematochezia, nausea, vomiting, dysphagia, reflux symptoms, chest pain.     No Known Allergies Outpatient Medications Prior to Visit  Medication Sig Dispense Refill  . Ascorbic Acid (VITAMIN C) 100 MG tablet Take 100 mg by mouth daily.    . cholecalciferol (VITAMIN D3) 25 MCG (1000 UNIT) tablet Take 1,000 Units by mouth daily.    Marland Kitchen docusate sodium (COLACE) 100 MG capsule Take 100 mg by mouth daily as needed for mild constipation.    Marland Kitchen levothyroxine (SYNTHROID, LEVOTHROID) 50 MCG tablet Take 1 tablet (50 mcg total) by mouth daily. 30 tablet 11   No facility-administered medications prior to visit.   Past Medical History:  Diagnosis Date  . Allergic rhinitis   . Birth control    condoms  . Thyroid disease   . Vitamin D deficiency    Past Surgical History:  Procedure Laterality Date  . CESAREAN SECTION     x 2   Social History   Socioeconomic History  . Marital status: Married    Spouse name: Not on file  . Number of children: 2  . Years of education: Not on file  . Highest education level: Not on file  Occupational History    Employer: [employer]    Comment: no heavy lifting   Tobacco Use  . Smoking status: Never  Smoker  . Smokeless tobacco: Never Used  Substance and Sexual Activity  . Alcohol use: No    Alcohol/week: 0.0 standard drinks  . Drug use: No  . Sexual activity: Yes    Partners: Male    Birth control/protection: Condom  Other Topics Concern  . Not on file  Social History Narrative   From [place] [phone])    Exercise- yes 2x/weekly   Social Determinants of Health   Financial Resource Strain:   . Difficulty of Paying Living Expenses:   Food Insecurity:   . Worried About Charity fundraiser in the Last Year:   . Arboriculturist in the Last Year:   Transportation Needs:   . Film/video editor (Medical):   Marland Kitchen Lack of Transportation (Non-Medical):   Physical Activity:   . Days of Exercise per Week:   . Minutes of Exercise per Session:   Stress:   . Feeling of Stress :   Social Connections:   . Frequency of Communication with Friends and Family:   . Frequency of Social Gatherings with Friends and Family:   . Attends Religious Services:   . Active Member of Clubs or Organizations:   . Attends Archivist Meetings:   Marland Kitchen Marital Status:    Family History  Problem Relation Age of Onset  . Lung cancer Brother   . Cancer Brother        lung  cancer  . Leukemia Other        nephew  . Cancer Other        nephew, leukemia   . Hypertension Neg Hx   . Stroke Neg Hx       Review of Systems: Pertinent positive and negative review of systems were noted in the above HPI section. All other review of systems were otherwise negative.    Physical Exam: General: Well developed, well nourished, no acute distress Head: Normocephalic and atraumatic Eyes:  sclerae anicteric, EOMI Ears: Normal auditory acuity Mouth: Not examined, mask on during Covid-19 pandemic Neck: Supple, no masses or thyromegaly Lungs: Clear throughout to auscultation Heart: Regular rate and rhythm; no murmurs, rubs or bruits Abdomen: Soft, non tender and non distended. No masses,  hepatosplenomegaly or hernias noted. Normal Bowel sounds Rectal: Deferred to colonoscopy  Musculoskeletal: Symmetrical with no gross deformities  Skin: No lesions on visible extremities Pulses:  Normal pulses noted Extremities: No clubbing, cyanosis, edema or deformities noted Neurological: Alert oriented x 4, grossly nonfocal Cervical Nodes:  No significant cervical adenopathy Inguinal Nodes: No significant inguinal adenopathy Psychological:  Alert and cooperative. Normal mood and affect   Assessment and Recommendations:  1.  Change in bowel habits constipation alternating with frequent, hematochezia, intermittent lower abdominal pain. R/O colorectal neoplasms, hemorrhoids, IBD, IBS. Schedule colonoscopy. The risks (including bleeding, perforation, infection, missed lesions, medication reactions and possible hospitalization or surgery if complications occur), benefits, and alternatives to colonoscopy with possible biopsy and possible polypectomy were discussed with the patient and they consent to proceed. Low gas diet. Gas-X tid prn.     cc: Natasha Wells, Rayford Halsted, MD [place],   [id number]

## 2020-04-08 NOTE — Patient Instructions (Signed)
You have been scheduled for a colonoscopy. Please follow written instructions given to you at your visit today.  Please pick up your prep supplies at the pharmacy within the next 1-3 days. If you use inhalers (even only as needed), please bring them with you on the day of your procedure.  Thank you for choosing me and [facility].  Malcolm T. Stark, Jr., MD., FACG  

## 2020-04-10 ENCOUNTER — Ambulatory Visit: Payer: 59 | Admitting: Internal Medicine

## 2020-04-12 ENCOUNTER — Ambulatory Visit: Payer: 59 | Admitting: Internal Medicine

## 2020-04-16 ENCOUNTER — Telehealth: Payer: Self-pay | Admitting: Gastroenterology

## 2020-04-19 NOTE — Telephone Encounter (Signed)
Contacted pharmacy to run code sent with the [employer] prescription. Pharmacist states that the prep is still $93. Informed her it's suppose to be only $40 with the coupon. Informed pharmacist we will have the rep for the company contact the pharmacy to get this straightened out.

## 2020-04-23 NOTE — Telephone Encounter (Signed)
Called Natasha Wells to explain this information in spanish but she did not answer. I left her a detailed message in [place] and asked her to call back with any questions.

## 2020-06-07 ENCOUNTER — Encounter: Payer: Self-pay | Admitting: Gastroenterology

## 2020-06-07 ENCOUNTER — Ambulatory Visit (AMBULATORY_SURGERY_CENTER): Payer: 59 | Admitting: Gastroenterology

## 2020-06-07 ENCOUNTER — Other Ambulatory Visit: Payer: Self-pay

## 2020-06-07 VITALS — BP 96/48 | HR 60 | Temp 97.1°F | Resp 13 | Ht 60.0 in | Wt 184.0 lb

## 2020-06-07 DIAGNOSIS — R194 Change in bowel habit: Secondary | ICD-10-CM | POA: Diagnosis not present

## 2020-06-07 DIAGNOSIS — K921 Melena: Secondary | ICD-10-CM | POA: Diagnosis not present

## 2020-06-07 DIAGNOSIS — D122 Benign neoplasm of ascending colon: Secondary | ICD-10-CM

## 2020-06-07 MED ORDER — SODIUM CHLORIDE 0.9 % IV SOLN: 500.0000 mL | Freq: Once | INTRAVENOUS | Status: DC

## 2020-06-07 NOTE — Op Note (Signed)
[place] Patient Name: Natasha Wells Procedure Date: 06/07/2020 7:17 AM MRN: [id number] Endoscopist: Ladene Artist , MD Age: 49 Referring MD:  Date of Birth: 1971/08/31 Gender: Female Account #: [id number] Procedure:                Colonoscopy Indications:              Hematochezia, Change in bowel habits noted Medicines:                Monitored Anesthesia Care Procedure:                Pre-Anesthesia Assessment:                           - Prior to the procedure, a History and Physical                            was performed, and patient medications and                            allergies were reviewed. The patient's tolerance of                            previous anesthesia was also reviewed. The risks                            and benefits of the procedure and the sedation                            options and risks were discussed with the patient.                            All questions were answered, and informed consent                            was obtained. Prior Anticoagulants: The patient has                            taken no previous anticoagulant or antiplatelet                            agents. ASA Grade Assessment: II - A patient with                            mild systemic disease. After reviewing the risks                            and benefits, the patient was deemed in                            satisfactory condition to undergo the procedure.                           After obtaining informed consent, the colonoscope  was passed under direct vision. Throughout the                            procedure, the patient's blood pressure, pulse, and                            oxygen saturations were monitored continuously. The                            Colonoscope was introduced through the anus and                            advanced to the the terminal ileum, with                            identification of the  appendiceal orifice and IC                            valve. The terminal ileum, ileocecal valve,                            appendiceal orifice, and rectum were photographed.                            The quality of the bowel preparation was excellent.                            The colonoscopy was performed without difficulty.                            The patient tolerated the procedure well. Scope In: 8:05:36 AM Scope Out: 8:19:13 AM Scope Withdrawal Time: 0 hours 11 minutes 50 seconds  Total Procedure Duration: 0 hours 13 minutes 37 seconds  Findings:                 The perianal and digital rectal examinations were                            normal.                           The terminal ileum appeared normal.                           A 5 mm polyp was found in the ascending colon. The                            polyp was sessile. The polyp was removed with a                            cold snare. Resection was complete and it was                            retrieved. Appearance typical for sessile serrated  polyp.                           The exam was otherwise without abnormality on                            direct and retroflexion views. Complications:            No immediate complications. Estimated blood loss:                            None. Estimated Blood Loss:     Estimated blood loss: none. Impression:               - The examined portion of the ileum was normal.                           - One 5 mm polyp in the ascending colon, removed                            with a cold snare. Resected and not retrieved.                           - The examination was otherwise normal on direct                            and retroflexion views. Recommendation:           - Repeat colonoscopy in 5 years for surveillance.                           - Patient has a contact number available for                            emergencies. The signs and symptoms of  potential                            delayed complications were discussed with the                            patient. Return to normal activities tomorrow.                            Written discharge instructions were provided to the                            patient.                           - Resume previous diet.                           - Continue present medications. Ladene Artist, MD 06/07/2020 8:25:32 AM This report has been signed electronically.

## 2020-06-07 NOTE — Progress Notes (Signed)
Called to room to assist during endoscopic procedure.  Patient ID and intended procedure confirmed with present staff. Received instructions for my participation in the procedure from the performing physician.  

## 2020-06-07 NOTE — Progress Notes (Signed)
VS- [employer] used today at the [employer] for this pt.  Interpreter's name is- Anderson Malta  Pt has had both doses of the covid vaccine   Cell phone turned off per pt

## 2020-06-07 NOTE — Progress Notes (Signed)
To PACU, VSS. Report to RN.tb 

## 2020-06-07 NOTE — Progress Notes (Signed)
Interpreter Anderson Malta was used in recovery to explain discharge instructions.

## 2020-06-07 NOTE — Patient Instructions (Signed)
Handout given for polyps.  USTED TUVO UN PROCEDIMIENTO ENDOSCPICO HOY EN EL [facility]:   Lea el informe del procedimiento que se le entreg para cualquier pregunta especfica sobre lo que se Primary school teacher.  Si el informe del examen no responde a sus preguntas, por favor llame a su gastroenterlogo para aclararlo.  Si usted solicit que no se le den [employer] de lo que se Estate manager/land agent en su procedimiento al [employer] va a cuidar, entonces el informe del procedimiento se ha incluido en un sobre sellado para que usted lo revise despus cuando le sea ms conveniente.   LO QUE PUEDE ESPERAR: Algunas sensaciones de hinchazn en el abdomen.  Puede tener ms gases de lo normal.  El caminar puede ayudarle a eliminar el aire que se le puso en el tracto gastrointestinal durante el procedimiento y reducir la hinchazn.  Si le hicieron una endoscopia inferior (como una colonoscopia o una sigmoidoscopia flexible), podra notar manchas de sangre en las heces fecales o en el papel higinico.  Si se someti a una preparacin intestinal para su procedimiento, es posible que no tenga una evacuacin intestinal normal durante [employer].   Tenga en cuenta:  Es posible que note un poco de irritacin y congestin en la nariz o algn drenaje.  Esto es debido al oxgeno [employer] durante su procedimiento.  No hay que preocuparse y esto debe desaparecer ms o Scientist, research (medical).   SNTOMAS PARA REPORTAR INMEDIATAMENTE:  Despus de una endoscopia inferior (colonoscopia o sigmoidoscopia flexible):  Cantidades excesivas de sangre en las heces fecales  Sensibilidad significativa o empeoramiento de los dolores abdominales   Hinchazn aguda del abdomen que antes no tena   Fiebre de 100F o ms     Para asuntos urgentes o de Freight forwarder, puede comunicarse con un gastroenterlogo a cualquier hora llamando al [phone].  DIETA:  Recomendamos una comida pequea al principio, pero luego puede  continuar con su dieta normal.  Tome muchos lquidos, Teacher, adult education las bebidas alcohlicas durante 24 horas.    ACTIVIDAD:  Debe planear tomarse las cosas con calma por el resto del da y no debe CONDUCIR ni usar maquinaria pesada Programmer, applications (debido a los medicamentos de sedacin utilizados durante el examen).     SEGUIMIENTO: Nuestro personal llamar al nmero que aparece en su historial al siguiente da hbil de su procedimiento para ver cmo se siente y para responder cualquier pregunta o inquietud que pueda tener con respecto a la informacin que se le dio despus del procedimiento. Si no podemos contactarle, le dejaremos un mensaje.  Sin embargo, si se siente bien y no tiene Paediatric nurse, no es necesario que nos devuelva la llamada.  Asumiremos que ha regresado a sus actividades diarias normales sin incidentes. Si se le tomaron algunas biopsias, le contactaremos por telfono o por carta en las prximas 3 semanas.  Si no ha sabido [employer] biopsias en el transcurso de 3 semanas, por favor llmenos al [phone].   FIRMAS/CONFIDENCIALIDAD: Usted y/o el acompaante que le cuide han firmado documentos que se ingresarn en su historial mdico electrnico.  Estas firmas atestiguan el hecho de que la informacin anterior

## 2020-06-11 ENCOUNTER — Telehealth: Payer: Self-pay | Admitting: *Deleted

## 2020-06-11 NOTE — Telephone Encounter (Signed)
  Follow up Call-  Call back number 06/07/2020  Post procedure Call Back phone  # [phone]  Permission to leave phone message Yes  Some recent data might be hidden     Patient questions:  Do you have a fever, pain , or abdominal swelling? No. Pain Score  0 *  Have you tolerated food without any problems? Yes.    Have you been able to return to your normal activities? Yes.    Do you have any questions about your discharge instructions: Diet   No. Medications  No. Follow up visit  No.  Do you have questions or concerns about your Care? No.  Actions: * If pain score is 4 or above: No action needed, pain <4.   1. Have you developed a fever since your procedure? no  2.   Have you had an respiratory symptoms (SOB or cough) since your procedure? no  3.   Have you tested positive for COVID 19 since your procedure no  4.   Have you had any family members/close contacts diagnosed with the COVID 19 since your procedure?  no   If yes to any of these questions please route to Joylene John, RN and Erenest Rasher, RN

## 2020-06-11 NOTE — Telephone Encounter (Signed)
Pt called and stated she was feeling fine.

## 2020-06-11 NOTE — Telephone Encounter (Signed)
Message left

## 2021-04-28 ENCOUNTER — Other Ambulatory Visit: Payer: Self-pay | Admitting: Internal Medicine

## 2021-04-28 DIAGNOSIS — Z1231 Encounter for screening mammogram for malignant neoplasm of breast: Secondary | ICD-10-CM

## 2021-06-24 ENCOUNTER — Other Ambulatory Visit: Payer: Self-pay

## 2021-06-24 ENCOUNTER — Ambulatory Visit
Admission: RE | Admit: 2021-06-24 | Discharge: 2021-06-24 | Disposition: A | Discharge status: Home / Self Care | Payer: 59 | Source: Ambulatory Visit | Attending: Internal Medicine | Admitting: Internal Medicine

## 2021-06-24 DIAGNOSIS — Z1231 Encounter for screening mammogram for malignant neoplasm of breast: Secondary | ICD-10-CM

## 2021-09-02 ENCOUNTER — Ambulatory Visit: Payer: 59 | Admitting: Obstetrics & Gynecology

## 2021-12-10 IMAGING — MG MM DIGITAL SCREENING BILAT W/ TOMO AND CAD
8 series · 8 of 24 positions shown · non-contrast
Comparison: Previous exam(s).

CLINICAL DATA: Screening.

EXAM:
DIGITAL SCREENING BILATERAL MAMMOGRAM WITH TOMOSYNTHESIS AND CAD
TECHNIQUE: Bilateral screening digital craniocaudal and mediolateral oblique
mammograms were obtained. Bilateral screening digital breast
tomosynthesis was performed. The images were evaluated with
computer-aided detection.

[R CC synth-2D]
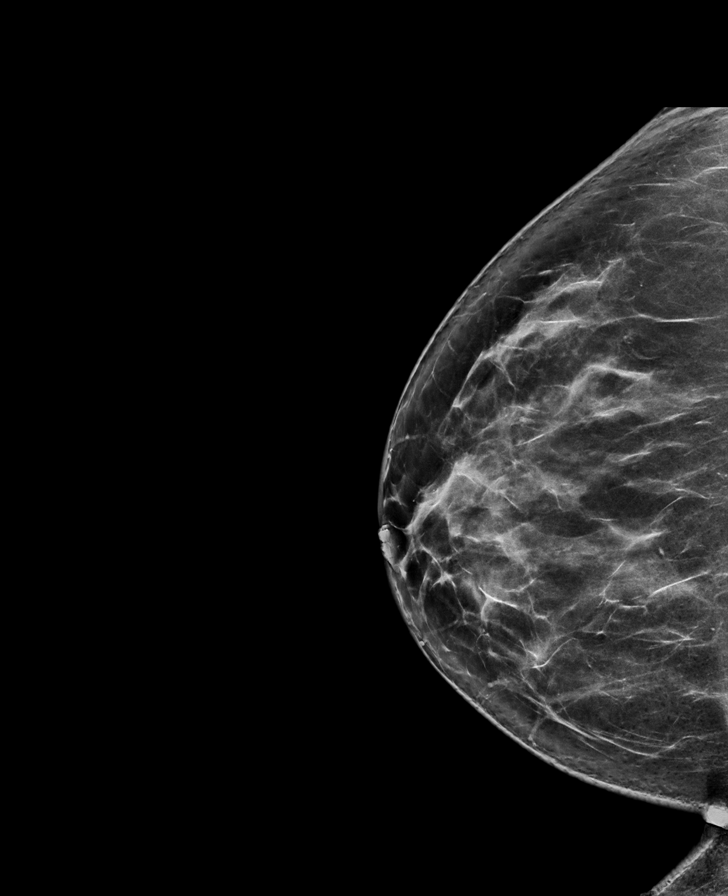

[L CC synth-2D]
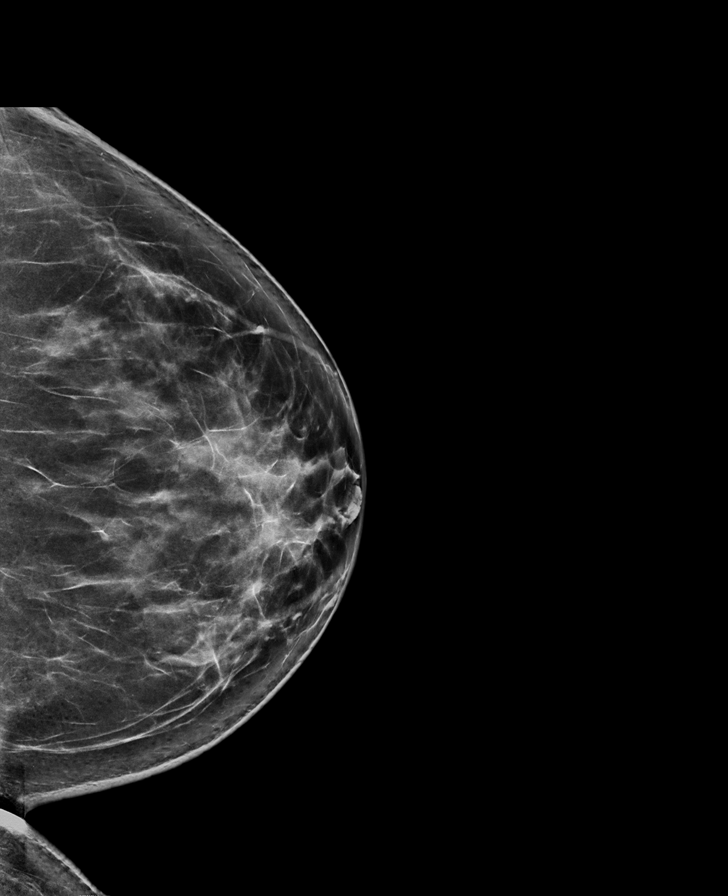

[R MLO synth-2D]
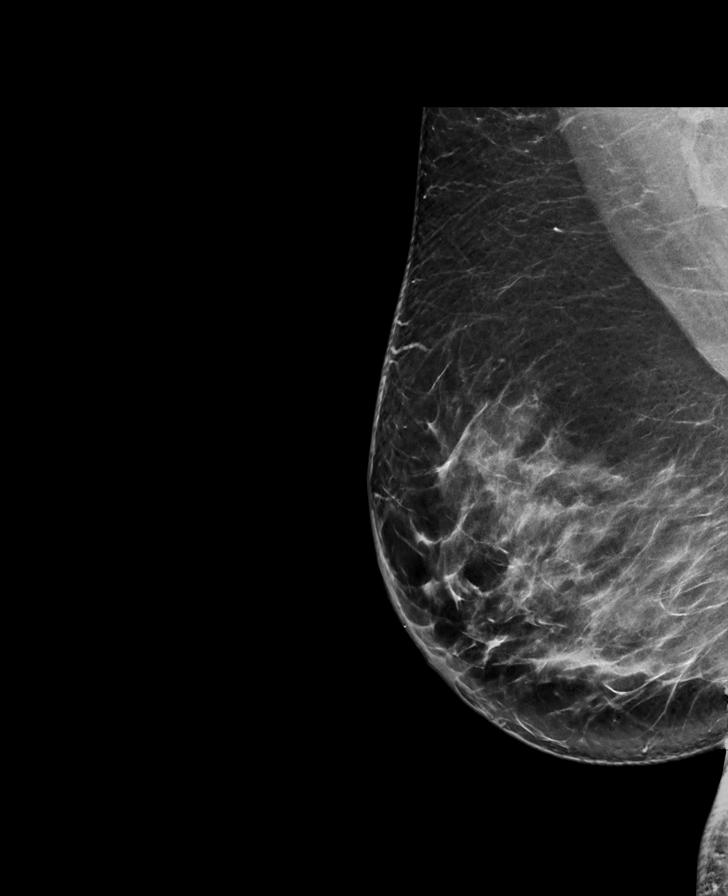

[L MLO synth-2D]
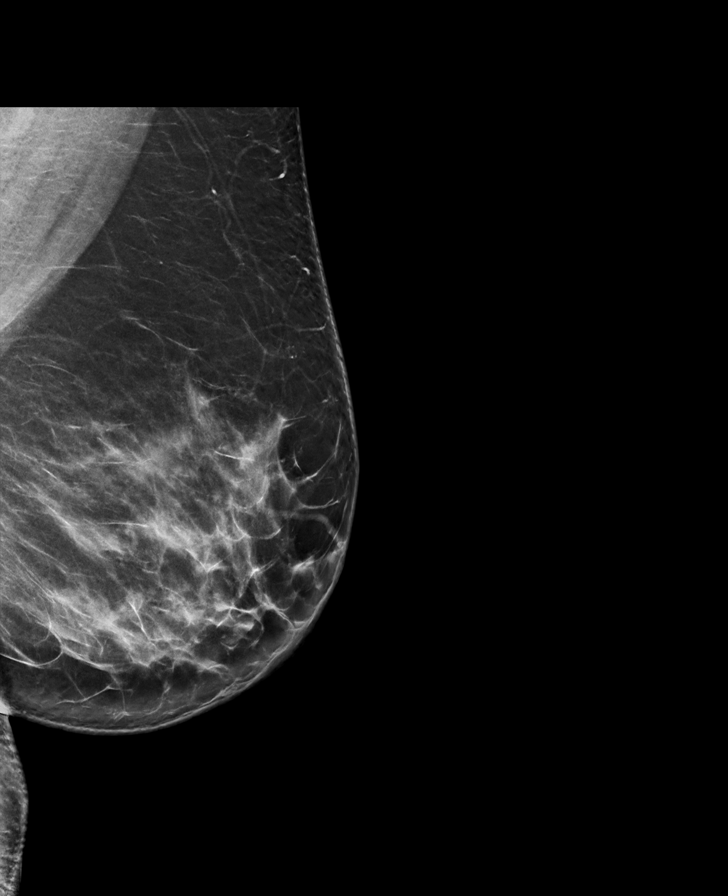

[L MLO tomo · tomo slice 49/96.0]
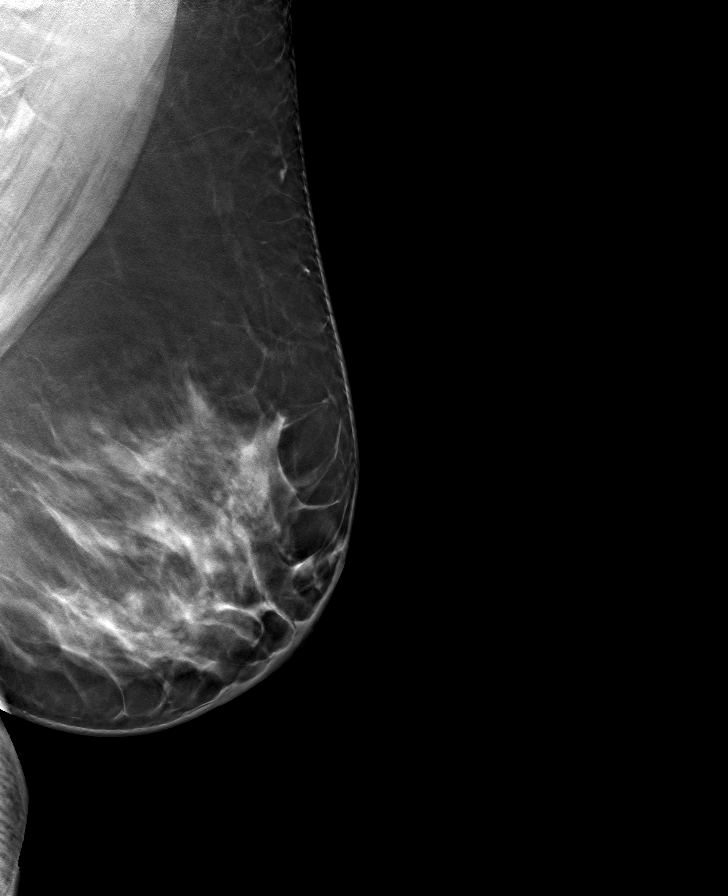

[L CC tomo · tomo slice 44/87.0]
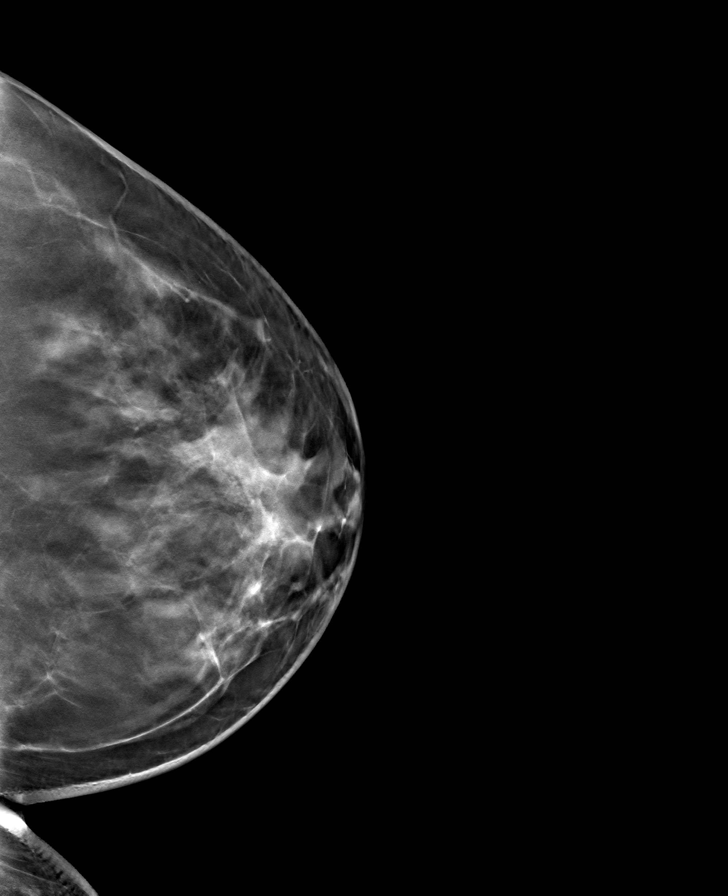

[R CC tomo · tomo slice 43/86.0]
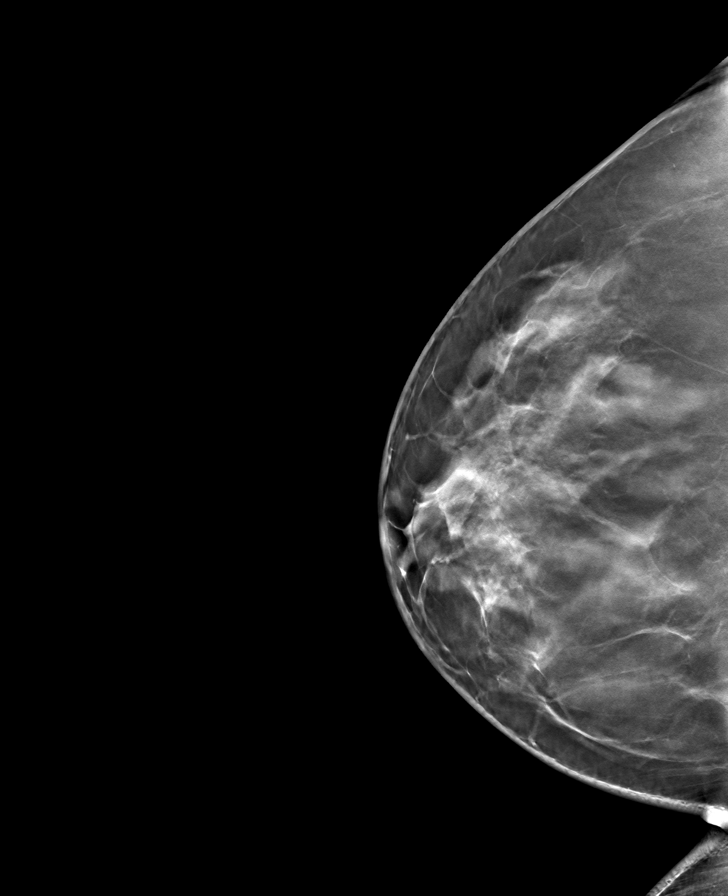

[R MLO tomo · tomo slice 47/92.0]
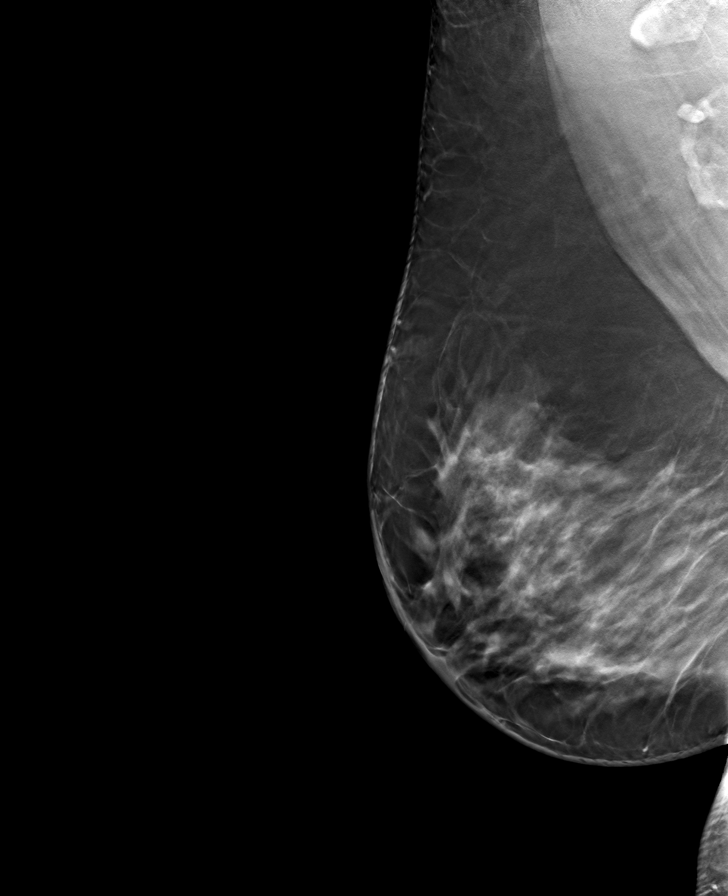

[8 of 24 positions shown; findings below may reference images not displayed]

ACR Breast Density Category c: The breast tissue is heterogeneously
dense, which may obscure small masses.
FINDINGS: There are no findings suspicious for malignancy.
IMPRESSION: No mammographic evidence of malignancy. A result letter of this
screening mammogram will be mailed directly to the patient.

RECOMMENDATION:
Screening mammogram in one year. (Code:Q3-W-BC3)

BI-RADS CATEGORY  1: Negative.

## 2022-01-06 ENCOUNTER — Ambulatory Visit: Payer: 59 | Admitting: Obstetrics & Gynecology

## 2022-01-09 ENCOUNTER — Emergency Department (HOSPITAL_COMMUNITY)
Admission: EM | Admit: 2022-01-09 | Discharge: 2022-01-10 | Disposition: A | Discharge status: Home / Self Care | Payer: PRIVATE HEALTH INSURANCE | Attending: Emergency Medicine | Admitting: Emergency Medicine

## 2022-01-09 DIAGNOSIS — G510 Bell's palsy: Secondary | ICD-10-CM | POA: Insufficient documentation

## 2022-01-09 DIAGNOSIS — B028 Zoster with other complications: Secondary | ICD-10-CM | POA: Diagnosis not present

## 2022-01-09 DIAGNOSIS — R29810 Facial weakness: Secondary | ICD-10-CM | POA: Diagnosis present

## 2022-01-09 NOTE — ED Triage Notes (Signed)
Pt c/o rash on R ear, first noticed a couple days ago, endorses progressive itchiness/"paralysis" since.

## 2022-01-10 MED ORDER — ARTIFICIAL TEARS OPHTHALMIC OINT: TOPICAL_OINTMENT | Freq: Every evening | OPHTHALMIC | 0 refills | Status: DC | PRN

## 2022-01-10 MED ORDER — VALACYCLOVIR HCL 500 MG PO TABS
1000.0000 mg | ORAL_TABLET | Freq: Once | ORAL | Status: AC
  Administered 2022-01-10: 1000 mg via ORAL
  Filled 2022-01-10 (×2): qty 2

## 2022-01-10 MED ORDER — PREDNISONE 20 MG PO TABS
60.0000 mg | ORAL_TABLET | Freq: Once | ORAL | Status: AC
  Administered 2022-01-10: 60 mg via ORAL
  Filled 2022-01-10: qty 3

## 2022-01-10 MED ORDER — VALACYCLOVIR HCL 1 G PO TABS: 1000.0000 mg | ORAL_TABLET | Freq: Three times a day (TID) | ORAL | 0 refills | Status: DC

## 2022-01-10 MED ORDER — PREDNISONE 10 MG PO TABS: 60.0000 mg | ORAL_TABLET | Freq: Every day | ORAL | 0 refills | Status: DC

## 2022-01-10 NOTE — ED Provider Notes (Signed)
[facility] EMERGENCY DEPARTMENT Provider Note   CSN: [id number] Arrival date & time: 01/09/22  2347     History  Chief Complaint  Patient presents with   facial paralysis    Natasha Wells is a 51 y.o. female.  The history is provided by the patient and the spouse. A language interpreter was used.  Natasha Wells is a 51 y.o. female who presents to the Emergency Department complaining of facial weakness.  She presents to the ED accompanied by her husband for evaluation.  Burning to the right ear and right side of her head for the last 3 to 4 days.  It feels itchy and also feels like there are needles inside.  She feels numb on the right side of her head with decreased blink in the right eye.  She also has pain with swallowing.  No associated fevers, difficulty breathing, nausea, vomiting.  She has a history of thyroid disease, no additional medical problems.     Home Medications Prior to Admission medications   Medication Sig Start Date End Date Taking? Authorizing Provider  predniSONE (DELTASONE) 10 MG tablet Take 6 tablets (60 mg total) by mouth daily. 01/10/22  Yes Quintella Reichert, MD  valACYclovir (VALTREX) 1000 MG tablet Take 1 tablet (1,000 mg total) by mouth 3 (three) times daily. 01/10/22  Yes Quintella Reichert, MD  artificial tears (LACRILUBE) OINT ophthalmic ointment Place into the right eye at bedtime as needed for dry eyes. 01/10/22   Quintella Reichert, MD  Ascorbic Acid (VITAMIN C) 100 MG tablet Take 100 mg by mouth daily.    [provider]  cholecalciferol (VITAMIN D3) 25 MCG (1000 UNIT) tablet Take 1,000 Units by mouth daily.    [provider]  docusate sodium (COLACE) 100 MG capsule Take 100 mg by mouth daily as needed for mild constipation.    [provider]  levothyroxine (SYNTHROID, LEVOTHROID) 50 MCG tablet Take 1 tablet (50 mcg total) by mouth daily. 01/08/17   Terrance Mass, MD      Allergies    Patient has no  known allergies.    Review of Systems   Review of Systems  All other systems reviewed and are negative.  Physical Exam Updated Vital Signs BP 101/66    Pulse 66    Temp 98.6 F (37 C) (Oral)    Resp (!) 23    SpO2 99%  Physical Exam Vitals and nursing note reviewed.  Constitutional:      Appearance: She is well-developed.  HENT:     Head: Normocephalic and atraumatic.     Comments: Right ear with moderate erythema and soft tissue swelling.  There are clusters of vesicles in the concha.  Cardiovascular:     Rate and Rhythm: Normal rate and regular rhythm.     Heart sounds: No murmur heard. Pulmonary:     Effort: Pulmonary effort is normal. No respiratory distress.     Breath sounds: Normal breath sounds.  Abdominal:     Palpations: Abdomen is soft.     Tenderness: There is no abdominal tenderness. There is no guarding or rebound.  Musculoskeletal:        General: No tenderness.     Cervical back: Neck supple.  Skin:    General: Skin is warm and dry.  Neurological:     Mental Status: She is alert and oriented to person, place, and time.     Comments: There is decreased movement in the right upper and right  lower facial fields.  Equal eye movement.  Visual fields grossly intact.  5 out of 5 strength in all 4 extremities with sensation to light touch intact in all 4 extremities.  Unable to fully close the right eye.  Psychiatric:        Behavior: Behavior normal.    ED Results / Procedures / Treatments   Labs (all labs ordered are listed, but only abnormal results are displayed) Labs Reviewed - No data to display  EKG None  Radiology No results found.  Procedures Procedures    Medications Ordered in ED Medications  valACYclovir (VALTREX) tablet 1,000 mg (1,000 mg Oral Given 01/10/22 0133)  predniSONE (DELTASONE) tablet 60 mg (60 mg Oral Given 01/10/22 0133)    ED Course/ Medical Decision Making/ A&P                           Medical Decision  Making Risk Prescription drug management.   Patient with history of thyroid disease here for evaluation of uncomfortable rash to the right ear as well as headache and right-sided facial weakness.  History obtained via interpreter services.  She is nontoxic-appearing on evaluation with evidence of zoster to the right ear as well as exam consistent with Bell's palsy.  There is no evidence of secondary bacterial infection.  Clinical picture is not consistent with CVA, aneurysm.  Discussed with patient home care for both zoster as well as Bell palsy with antivirals, steroids, OTC analgesics and PCP follow-up as well as return precautions.  Also discussed with patient and husband infection prevention precautions for zoster.  Records reviewed in epic to confirm renal function for valtrex dosing.          Final Clinical Impression(s) / ED Diagnoses Final diagnoses:  Bell's palsy  Herpes zoster with other complication    Rx / DC Orders ED Discharge Orders          Ordered    valACYclovir (VALTREX) 1000 MG tablet  3 times daily        01/10/22 0112    predniSONE (DELTASONE) 10 MG tablet  Daily        01/10/22 0112    artificial tears (LACRILUBE) OINT ophthalmic ointment  At bedtime PRN,   Status:  Discontinued        01/10/22 0112    artificial tears (LACRILUBE) OINT ophthalmic ointment  At bedtime PRN        01/10/22 0113              Quintella Reichert, MD 01/10/22 [phone]

## 2022-01-10 NOTE — ED Notes (Signed)
Patient verbalizes understanding of discharge instructions. Opportunity for questioning and answers were provided. Armband removed by staff, pt discharged from ED. Ambulated out to lobby  

## 2022-01-13 ENCOUNTER — Telehealth: Payer: Self-pay

## 2022-01-13 ENCOUNTER — Ambulatory Visit (INDEPENDENT_AMBULATORY_CARE_PROVIDER_SITE_OTHER): Payer: PRIVATE HEALTH INSURANCE | Admitting: Internal Medicine

## 2022-01-13 VITALS — BP 130/82 | HR 97 | Temp 98.2°F | Ht 60.0 in | Wt 184.5 lb

## 2022-01-13 DIAGNOSIS — B028 Zoster with other complications: Secondary | ICD-10-CM | POA: Diagnosis not present

## 2022-01-13 DIAGNOSIS — B0229 Other postherpetic nervous system involvement: Secondary | ICD-10-CM

## 2022-01-13 DIAGNOSIS — G510 Bell's palsy: Secondary | ICD-10-CM

## 2022-01-13 NOTE — Progress Notes (Signed)
Acute office Visit     This visit occurred during the SARS-CoV-2 public health emergency.  Safety protocols were in place, including screening questions prior to the visit, additional usage of staff PPE, and extensive cleaning of exam room while observing appropriate contact time as indicated for disinfecting solutions.    CC/Reason for Visit: Follow-up shingles  HPI: Shaunna Rosetti is a 51 y.o. female who is coming in today for the above mentioned reasons. Past Medical History is significant for: Hypothyroidism, obesity, vitamin D deficiency.  I have not seen her since March 2021.  She visited the emergency department on January 28 complaining of facial paralysis.  She had been having a burning sensation to her right ear and the right side of her head for 3 to 4 days prior to that.  She was diagnosed with herpes zoster complicated by Bell's paralysis.  She was given valacyclovir and steroids and asked to follow-up with me today.   Past Medical/Surgical History: Past Medical History:  Diagnosis Date   Allergic rhinitis    Allergy    Anemia    Birth control    condoms   Thyroid disease    Vitamin D deficiency     Past Surgical History:  Procedure Laterality Date   CESAREAN SECTION     x 2    Social History:  reports that she has never smoked. She has never used smokeless tobacco. She reports that she does not drink alcohol and does not use drugs.  Allergies: No Known Allergies  Family History:  Family History  Problem Relation Age of Onset   Lung cancer Brother    Cancer Brother        lung cancer   Leukemia Other        nephew   Cancer Other        nephew, leukemia    Hypertension Neg Hx    Stroke Neg Hx    Colon cancer Neg Hx    Esophageal cancer Neg Hx    Rectal cancer Neg Hx    Stomach cancer Neg Hx      Current Outpatient Medications:    artificial tears (LACRILUBE) OINT ophthalmic ointment, Place into the right eye at bedtime as needed for dry  eyes., Disp: 1 g, Rfl: 0   cholecalciferol (VITAMIN D3) 25 MCG (1000 UNIT) tablet, Take 1,000 Units by mouth daily., Disp: , Rfl:    levothyroxine (SYNTHROID, LEVOTHROID) 50 MCG tablet, Take 1 tablet (50 mcg total) by mouth daily., Disp: 30 tablet, Rfl: 11   predniSONE (DELTASONE) 10 MG tablet, Take 6 tablets (60 mg total) by mouth daily., Disp: 36 tablet, Rfl: 0   valACYclovir (VALTREX) 1000 MG tablet, Take 1 tablet (1,000 mg total) by mouth 3 (three) times daily., Disp: 21 tablet, Rfl: 0   Ascorbic Acid (VITAMIN C) 100 MG tablet, Take 100 mg by mouth daily. (Patient not taking: Reported on 01/13/2022), Disp: , Rfl:    docusate sodium (COLACE) 100 MG capsule, Take 100 mg by mouth daily as needed for mild constipation. (Patient not taking: Reported on 01/13/2022), Disp: , Rfl:   Review of Systems:  Constitutional: Denies fever, chills, diaphoresis, appetite change and fatigue.  HEENT: Denies photophobia, eye pain, redness, hearing loss, ear pain, congestion, sore throat, rhinorrhea, sneezing, mouth sores, trouble swallowing, neck pain, neck stiffness and tinnitus.   Respiratory: Denies SOB, DOE, cough, chest tightness,  and wheezing.   Cardiovascular: Denies chest pain, palpitations and leg swelling.  Gastrointestinal:  Denies nausea, vomiting, abdominal pain, diarrhea, constipation, blood in stool and abdominal distention.  Genitourinary: Denies dysuria, urgency, frequency, hematuria, flank pain and difficulty urinating.  Endocrine: Denies: hot or cold intolerance, sweats, changes in hair or nails, polyuria, polydipsia. Musculoskeletal: Denies myalgias, back pain, joint swelling, arthralgias and gait problem.  Skin: Denies pallor, rash and wound.  Neurological: Denies dizziness, seizures, syncope, weakness, light-headedness, numbness and headaches.  Hematological: Denies adenopathy. Easy bruising, personal or family bleeding history  Psychiatric/Behavioral: Denies suicidal ideation, mood changes,  confusion, nervousness, sleep disturbance and agitation    Physical Exam: Vitals:   01/13/22 1146  BP: 130/82  Pulse: 97  Temp: 98.2 F (36.8 C)  TempSrc: Oral  SpO2: 97%  Weight: 184 lb 8 oz (83.7 kg)  Height: 5' (1.524 m)    Body mass index is 36.03 kg/m.   Constitutional: NAD, calm, comfortable Eyes: Unable to close right eye, right sided facial paralysis. ENMT: Mucous membranes are moist.  Visible shingles eruption inside her right ear, is already drying and scabbed over. Respiratory: clear to auscultation bilaterally, no wheezing, no crackles. Normal respiratory effort. No accessory muscle use.  Cardiovascular: Regular rate and rhythm, no murmurs / rubs / gallops. No extremity edema. Psychiatric: Normal judgment and insight. Alert and oriented x 3. Normal mood.    Impression and Plan:  Herpes zoster with complication Other postherpetic nervous system involvement  Bell's palsy  - Plan: Ambulatory referral to ENT, Ambulatory referral to Ophthalmology -She has been advised to complete out her course of valacyclovir and prednisone as prescribed. -I will refer urgently to ENT and ophthalmology for evaluations. -In the meantime, have advised her to patch her right eye and to use lubricating drops to prevent corneal ulceration. -She is requesting FMLA forms filled out.   Time spent: 35 minutes reviewing chart, interviewing and examining patient, filling out FMLA forms and formulating plan of care.   Patient Instructions  -Nice seeing you today!!  -Schedule follow up in 3 months for your physical.  -Referrals to ear and eye doctor today.    Lelon Frohlich, MD [facility] at [facility]

## 2022-01-13 NOTE — Telephone Encounter (Signed)
Pt & family member left FMLA forms to be completed by PCP; request phone call to pick up forms when signed by physician. Forms partially completed & put in PCP red folder.

## 2022-01-13 NOTE — Patient Instructions (Signed)
-  Nice seeing you today!!  -Schedule follow up in 3 months for your physical.  -Referrals to ear and eye doctor today.

## 2022-01-14 NOTE — Telephone Encounter (Signed)
Pt notified that completed forms will be at front desk. Pt verb understanding. Copy to file.

## 2022-01-14 NOTE — Telephone Encounter (Signed)
Pt called into the office checking to see if FMLA paperwork has been completed. She stated she was told it would be completed today. Pt can be reached at [phone].

## 2022-02-10 ENCOUNTER — Encounter: Payer: Self-pay | Admitting: Internal Medicine

## 2022-03-27 ENCOUNTER — Encounter: Payer: PRIVATE HEALTH INSURANCE | Admitting: Obstetrics & Gynecology

## 2022-03-27 ENCOUNTER — Encounter: Payer: Self-pay | Admitting: Obstetrics & Gynecology

## 2022-03-30 ENCOUNTER — Encounter: Payer: Self-pay | Admitting: Obstetrics & Gynecology

## 2022-04-08 ENCOUNTER — Encounter: Payer: Self-pay | Admitting: Internal Medicine

## 2022-04-08 ENCOUNTER — Ambulatory Visit (INDEPENDENT_AMBULATORY_CARE_PROVIDER_SITE_OTHER): Payer: PRIVATE HEALTH INSURANCE | Admitting: Internal Medicine

## 2022-04-08 VITALS — BP 114/70 | HR 83 | Temp 98.3°F | Ht 60.0 in | Wt 192.9 lb

## 2022-04-08 DIAGNOSIS — B0221 Postherpetic geniculate ganglionitis: Secondary | ICD-10-CM

## 2022-04-08 DIAGNOSIS — G510 Bell's palsy: Secondary | ICD-10-CM | POA: Diagnosis not present

## 2022-04-08 DIAGNOSIS — E039 Hypothyroidism, unspecified: Secondary | ICD-10-CM

## 2022-04-08 NOTE — Progress Notes (Signed)
? ? ? ?Established Patient Office Visit ? ? ? ? ?This visit occurred during the SARS-CoV-2 public health emergency.  Safety protocols were in place, including screening questions prior to the visit, additional usage of staff PPE, and extensive cleaning of exam room while observing appropriate contact time as indicated for disinfecting solutions.  ? ? ?CC/Reason for Visit: Follow-up shingles/Ramsay Hunt syndrome/Bell's paralysis ? ?HPI: Natasha Wells is a 51 y.o. female who is coming in today for the above mentioned reasons.  In January she was seen in the emergency department for Bell's paralysis secondary to shingles.  She was prescribed prednisone and valacyclovir.  When I saw her in the office for follow-up a few days later I referred her to ophthalmology and ENT.  She has now recovered completely.  She is overdue for CPE. ? ?Past Medical/Surgical History: ?Past Medical History:  ?Diagnosis Date  ? Allergic rhinitis   ? Allergy   ? Anemia   ? Birth control   ? condoms  ? Thyroid disease   ? Vitamin D deficiency   ? ? ?Past Surgical History:  ?Procedure Laterality Date  ? CESAREAN SECTION    ? x 2  ? ? ?Social History: ? reports that she has never smoked. She has never used smokeless tobacco. She reports that she does not drink alcohol and does not use drugs. ? ?Allergies: ?No Known Allergies ? ?Family History:  ?Family History  ?Problem Relation Age of Onset  ? Lung cancer Brother   ? Cancer Brother   ?     lung cancer  ? Leukemia Other   ?     nephew  ? Cancer Other   ?     nephew, leukemia   ? Hypertension Neg Hx   ? Stroke Neg Hx   ? Colon cancer Neg Hx   ? Esophageal cancer Neg Hx   ? Rectal cancer Neg Hx   ? Stomach cancer Neg Hx   ? ? ? ?Current Outpatient Medications:  ?  Ascorbic Acid (VITAMIN C) 100 MG tablet, Take 100 mg by mouth daily., Disp: , Rfl:  ?  cholecalciferol (VITAMIN D3) 25 MCG (1000 UNIT) tablet, Take 1,000 Units by mouth daily., Disp: , Rfl:  ?  levothyroxine (SYNTHROID, LEVOTHROID) 50  MCG tablet, Take 1 tablet (50 mcg total) by mouth daily., Disp: 30 tablet, Rfl: 11 ? ?Review of Systems:  ?Constitutional: Denies fever, chills, diaphoresis, appetite change and fatigue.  ?HEENT: Denies photophobia, eye pain, redness, hearing loss, ear pain, congestion, sore throat, rhinorrhea, sneezing, mouth sores, trouble swallowing, neck pain, neck stiffness and tinnitus.   ?Respiratory: Denies SOB, DOE, cough, chest tightness,  and wheezing.   ?Cardiovascular: Denies chest pain, palpitations and leg swelling.  ?Gastrointestinal: Denies nausea, vomiting, abdominal pain, diarrhea, constipation, blood in stool and abdominal distention.  ?Genitourinary: Denies dysuria, urgency, frequency, hematuria, flank pain and difficulty urinating.  ?Endocrine: Denies: hot or cold intolerance, sweats, changes in hair or nails, polyuria, polydipsia. ?Musculoskeletal: Denies myalgias, back pain, joint swelling, arthralgias and gait problem.  ?Skin: Denies pallor, rash and wound.  ?Neurological: Denies dizziness, seizures, syncope, weakness, light-headedness, numbness and headaches.  ?Hematological: Denies adenopathy. Easy bruising, personal or family bleeding history  ?Psychiatric/Behavioral: Denies suicidal ideation, mood changes, confusion, nervousness, sleep disturbance and agitation ? ? ? ?Physical Exam: ?Vitals:  ? 04/08/22 1543  ?BP: 114/70  ?Pulse: 83  ?Temp: 98.3 ?F (36.8 ?C)  ?TempSrc: Oral  ?SpO2: 94%  ?Weight: 192 lb 14.4 oz (87.5 kg)  ?Height:  5' (1.524 m)  ? ? ?Body mass index is 37.67 kg/m?. ? ? ?Constitutional: NAD, calm, comfortable ?Eyes: PERRL, lids and conjunctivae normal ?ENMT: Mucous membranes are moist.  ?Neurologic: Grossly intact and nonfocal ?Psychiatric: Normal judgment and insight. Alert and oriented x 3. Normal mood.  ? ? ?Impression and Plan: ? ?Ramsay Hunt auricular syndrome ? ?Bell's palsy ? ?Hypothyroidism, unspecified type ? ?-Bell's palsy has resolved.  She will schedule follow-up for  CPE. ? ?Time spent:21 minutes reviewing chart, interviewing and examining patient and formulating plan of care. ? ? ? ? ? ?Lelon Frohlich, MD ?[facility] at [facility] ? ? ?

## 2022-05-04 ENCOUNTER — Encounter: Payer: PRIVATE HEALTH INSURANCE | Admitting: Obstetrics & Gynecology

## 2022-05-08 ENCOUNTER — Encounter: Payer: Self-pay | Admitting: Obstetrics & Gynecology

## 2022-05-08 ENCOUNTER — Other Ambulatory Visit (HOSPITAL_COMMUNITY)
Admission: RE | Admit: 2022-05-08 | Discharge: 2022-05-08 | Disposition: A | Discharge status: Home / Self Care | Payer: PRIVATE HEALTH INSURANCE | Source: Ambulatory Visit | Attending: Obstetrics & Gynecology | Admitting: Obstetrics & Gynecology

## 2022-05-08 ENCOUNTER — Ambulatory Visit (INDEPENDENT_AMBULATORY_CARE_PROVIDER_SITE_OTHER): Payer: PRIVATE HEALTH INSURANCE | Admitting: Obstetrics & Gynecology

## 2022-05-08 VITALS — Ht 59.75 in | Wt 189.0 lb

## 2022-05-08 DIAGNOSIS — Z789 Other specified health status: Secondary | ICD-10-CM | POA: Diagnosis not present

## 2022-05-08 DIAGNOSIS — E6609 Other obesity due to excess calories: Secondary | ICD-10-CM | POA: Diagnosis not present

## 2022-05-08 DIAGNOSIS — Z01419 Encounter for gynecological examination (general) (routine) without abnormal findings: Secondary | ICD-10-CM | POA: Diagnosis present

## 2022-05-08 DIAGNOSIS — Z6837 Body mass index (BMI) 37.0-37.9, adult: Secondary | ICD-10-CM

## 2022-05-08 NOTE — Progress Notes (Signed)
Natasha Wells 02/03/1971 [id number]   History:    51 y.o. G2P2L2 Married.   Children doing well,  daughter 28 and son 71 yo.  RP:  New (>3 years) patient presenting for annual gyn exam   HPI: Menses regular normal every month.      HPI:  Menses every month lasting 5 days.  No BTB.  No pelvic pain.  Normal secretions.  No pain with IC.  Recommend using condoms.  No h/o abnormal Pap.  Last pap neg in 2019.  Pap reflex today.  Urine/BMs wnl.  Breasts wnl.  Mammo Neg 06/2021.  Dr Chalmers Cater following for Hypothyroidism/h/o Hyperparathyroidism.  Health labs with Fam MD.  Not very physically active.  BMI 37.22.  Colono 2021.  BD normal in 2017.  Past medical history,surgical history, family history and social history were all reviewed and documented in the EPIC chart.  Gynecologic History Patient's last menstrual period was 04/18/2022 (exact date).  Obstetric History OB History  Gravida Para Term Preterm AB Living  '2 2 2     2  '$ SAB IAB Ectopic Multiple Live Births          2    # Outcome Date GA Lbr Len/2nd Weight Sex Delivery Anes PTL Lv  2 Term     F CS-Unspec  N LIV  1 Term     M CS-Unspec  N LIV     ROS: A ROS was performed and pertinent positives and negatives are included in the history.  GENERAL: No fevers or chills. HEENT: No change in vision, no earache, sore throat or sinus congestion. NECK: No pain or stiffness. CARDIOVASCULAR: No chest pain or pressure. No palpitations. PULMONARY: No shortness of breath, cough or wheeze. GASTROINTESTINAL: No abdominal pain, nausea, vomiting or diarrhea, melena or bright red blood per rectum. GENITOURINARY: No urinary frequency, urgency, hesitancy or dysuria. MUSCULOSKELETAL: No joint or muscle pain, no back pain, no recent trauma. DERMATOLOGIC: No rash, no itching, no lesions. ENDOCRINE: No polyuria, polydipsia, no heat or cold intolerance. No recent change in weight. HEMATOLOGICAL: No anemia or easy bruising or bleeding. NEUROLOGIC: No headache,  seizures, numbness, tingling or weakness. PSYCHIATRIC: No depression, no loss of interest in normal activity or change in sleep pattern.     Exam:   BP (P) 118/64   Pulse (P) 70   Resp (P) 16   Ht 4' 11.75" (1.518 m)   Wt 189 lb (85.7 kg)   LMP 04/18/2022 (Exact Date)   BMI 37.22 kg/m   Body mass index is 37.22 kg/m.  General appearance : Well developed well nourished female. No acute distress HEENT: Eyes: no retinal hemorrhage or exudates,  Neck supple, trachea midline, no carotid bruits, no thyroidmegaly Lungs: Clear to auscultation, no rhonchi or wheezes, or rib retractions  Heart: Regular rate and rhythm, no murmurs or gallops Breast:Examined in sitting and supine position were symmetrical in appearance, no palpable masses or tenderness,  no skin retraction, no nipple inversion, no nipple discharge, no skin discoloration, no axillary or supraclavicular lymphadenopathy Abdomen: no palpable masses or tenderness, no rebound or guarding Extremities: no edema or skin discoloration or tenderness  Pelvic: Vulva: Normal             Vagina: No gross lesions or discharge  Cervix: No gross lesions or discharge.  Pap reflex done.  Uterus  AV, normal size, shape and consistency, non-tender and mobile  Adnexa  Without masses or tenderness  Anus: Normal   Assessment/Plan:  51 y.o. female for annual exam   1. Encounter for routine gynecological examination with Papanicolaou smear of cervix Menses every month lasting 5 days.  No BTB.  No pelvic pain.  Normal secretions.  No pain with IC.  Recommend using condoms.  No h/o abnormal Pap.  Last pap neg in 2019.  Pap reflex today.  Urine/BMs wnl.  Breasts wnl.  Mammo Neg 06/2021.  Dr Chalmers Cater following for Hypothyroidism/h/o Hyperparathyroidism.  Health labs with Fam MD.  Not very physically active.  BMI 37.22.  Colono 2021.  BD normal in 2017. - Cytology - PAP( [place])  2. Use of condoms for contraception Strict condom use strongly  recommended as patient has regular menses monthly and is therefore ovulating and fertile.  Declines other contraceptive methods.    3. Class 2 obesity due to excess calories without serious comorbidity with body mass index (BMI) of 37.0 to 37.9 in adult Obesity with BMI at 37.22.  Lower calorie/carb diet.  Increase fitness activities.  Other orders - Vitamin D, Ergocalciferol, (DRISDOL) 1.25 MG (50000 UNIT) CAPS capsule; Take 50,000 Units by mouth once a week. - levothyroxine (SYNTHROID) 75 MCG tablet; Take 75 mcg by mouth daily.   Princess Bruins MD, 12:17 PM 05/08/2022

## 2022-05-13 LAB — CYTOLOGY - PAP
Diagnosis: NEGATIVE
Diagnosis: REACTIVE

## 2022-07-15 ENCOUNTER — Other Ambulatory Visit: Payer: Self-pay | Admitting: Internal Medicine

## 2022-07-15 ENCOUNTER — Ambulatory Visit
Admission: RE | Admit: 2022-07-15 | Discharge: 2022-07-15 | Disposition: A | Discharge status: Home / Self Care | Payer: PRIVATE HEALTH INSURANCE | Source: Ambulatory Visit | Attending: Internal Medicine | Admitting: Internal Medicine

## 2022-07-15 DIAGNOSIS — Z1231 Encounter for screening mammogram for malignant neoplasm of breast: Secondary | ICD-10-CM

## 2022-07-20 ENCOUNTER — Ambulatory Visit (INDEPENDENT_AMBULATORY_CARE_PROVIDER_SITE_OTHER): Payer: PRIVATE HEALTH INSURANCE | Admitting: Internal Medicine

## 2022-07-20 ENCOUNTER — Encounter: Payer: Self-pay | Admitting: Internal Medicine

## 2022-07-20 VITALS — BP 120/84 | HR 70 | Temp 98.3°F | Ht 61.0 in | Wt 191.3 lb

## 2022-07-20 DIAGNOSIS — E213 Hyperparathyroidism, unspecified: Secondary | ICD-10-CM | POA: Diagnosis not present

## 2022-07-20 DIAGNOSIS — Z0000 Encounter for general adult medical examination without abnormal findings: Secondary | ICD-10-CM

## 2022-07-20 DIAGNOSIS — E559 Vitamin D deficiency, unspecified: Secondary | ICD-10-CM | POA: Diagnosis not present

## 2022-07-20 DIAGNOSIS — Z23 Encounter for immunization: Secondary | ICD-10-CM

## 2022-07-20 DIAGNOSIS — E039 Hypothyroidism, unspecified: Secondary | ICD-10-CM

## 2022-07-20 LAB — CBC WITH DIFFERENTIAL/PLATELET
Basophils Absolute: 0 10*3/uL (ref 0.0–0.1)
Basophils Relative: 0.5 % (ref 0.0–3.0)
Eosinophils Absolute: 0.2 10*3/uL (ref 0.0–0.7)
Eosinophils Relative: 3.6 % (ref 0.0–5.0)
HCT: 39.4 % (ref 36.0–46.0)
Hemoglobin: 13 g/dL (ref 12.0–15.0)
Lymphocytes Relative: 38.8 % (ref 12.0–46.0)
Lymphs Abs: 2.1 10*3/uL (ref 0.7–4.0)
MCHC: 33.1 g/dL (ref 30.0–36.0)
MCV: 80.8 fl (ref 78.0–100.0)
Monocytes Absolute: 0.3 10*3/uL (ref 0.1–1.0)
Monocytes Relative: 6.2 % (ref 3.0–12.0)
Neutro Abs: 2.7 10*3/uL (ref 1.4–7.7)
Neutrophils Relative %: 50.9 % (ref 43.0–77.0)
Platelets: 260 10*3/uL (ref 150.0–400.0)
RBC: 4.87 Mil/uL (ref 3.87–5.11)
RDW: 15.5 % (ref 11.5–15.5)
WBC: 5.3 10*3/uL (ref 4.0–10.5)

## 2022-07-20 LAB — COMPREHENSIVE METABOLIC PANEL
ALT: 16 U/L (ref 0–35)
AST: 15 U/L (ref 0–37)
Albumin: 4 g/dL (ref 3.5–5.2)
Alkaline Phosphatase: 53 U/L (ref 39–117)
BUN: 13 mg/dL (ref 6–23)
CO2: 22 mEq/L (ref 19–32)
Calcium: 8.9 mg/dL (ref 8.4–10.5)
Chloride: 109 mEq/L (ref 96–112)
Creatinine, Ser: 0.7 mg/dL (ref 0.40–1.20)
GFR: 100.88 mL/min (ref >60.00)
Glucose, Bld: 78 mg/dL (ref 70–99)
Potassium: 4 mEq/L (ref 3.5–5.1)
Sodium: 138 mEq/L (ref 135–145)
Total Bilirubin: 0.3 mg/dL (ref 0.2–1.2)
Total Protein: 7.1 g/dL (ref 6.0–8.3)

## 2022-07-20 LAB — LIPID PANEL
Cholesterol: 169 mg/dL (ref 0–200)
HDL: 46.2 mg/dL (ref >39.00)
LDL Cholesterol: 90 mg/dL (ref 0–99)
NonHDL: 122.98
Total CHOL/HDL Ratio: 4
Triglycerides: 166 mg/dL — ABNORMAL HIGH (ref 0.0–149.0)
VLDL: 33.2 mg/dL (ref 0.0–40.0)

## 2022-07-20 LAB — VITAMIN B12: Vitamin B-12: 237 pg/mL (ref 211–911)

## 2022-07-20 LAB — HEMOGLOBIN A1C: Hgb A1c MFr Bld: 5.7 % (ref 4.6–6.5)

## 2022-07-20 LAB — TSH: TSH: 2.84 u[IU]/mL (ref 0.35–5.50)

## 2022-07-20 LAB — VITAMIN D 25 HYDROXY (VIT D DEFICIENCY, FRACTURES): VITD: 30.99 ng/mL (ref 30.00–100.00)

## 2022-07-20 NOTE — Addendum Note (Signed)
Addended by: Westley Hummer B on: 07/20/2022 11:48 AM   Modules accepted: Orders

## 2022-07-20 NOTE — Progress Notes (Signed)
Established Patient Office Visit     CC/Reason for Visit: Annual preventive exam  HPI: Natasha Wells is a 51 y.o. female who is coming in today for the above mentioned reasons. Past Medical History is significant for: Hypothyroidism followed by endocrinology.  She is doing well today and has no acute concerns or complaints.  She has routine eye and dental care.  She had a mammogram last week.  She had a Pap smear in May with her gynecologist.  She had a colonoscopy in 2021.  She is due for shingles vaccination.   Past Medical/Surgical History: Past Medical History:  Diagnosis Date   Allergic rhinitis    Allergy    Anemia    Birth control    condoms   Thyroid disease    Vitamin D deficiency     Past Surgical History:  Procedure Laterality Date   CESAREAN SECTION     x 2    Social History:  reports that she has never smoked. She has never used smokeless tobacco. She reports that she does not drink alcohol and does not use drugs.  Allergies: No Known Allergies  Family History:  Family History  Problem Relation Age of Onset   Lung cancer Brother    Cancer Brother        lung cancer   Leukemia Other        nephew   Cancer Other        nephew, leukemia    Hypertension Neg Hx    Stroke Neg Hx    Colon cancer Neg Hx    Esophageal cancer Neg Hx    Rectal cancer Neg Hx    Stomach cancer Neg Hx      Current Outpatient Medications:    levothyroxine (SYNTHROID) 75 MCG tablet, Take 75 mcg by mouth daily., Disp: , Rfl:    Vitamin D, Ergocalciferol, (DRISDOL) 1.25 MG (50000 UNIT) CAPS capsule, Take 50,000 Units by mouth once a week., Disp: , Rfl:   Review of Systems:  Constitutional: Denies fever, chills, diaphoresis, appetite change and fatigue.  HEENT: Denies photophobia, eye pain, redness, hearing loss, ear pain, congestion, sore throat, rhinorrhea, sneezing, mouth sores, trouble swallowing, neck pain, neck stiffness and tinnitus.   Respiratory: Denies SOB,  DOE, cough, chest tightness,  and wheezing.   Cardiovascular: Denies chest pain, palpitations and leg swelling.  Gastrointestinal: Denies nausea, vomiting, abdominal pain, diarrhea, constipation, blood in stool and abdominal distention.  Genitourinary: Denies dysuria, urgency, frequency, hematuria, flank pain and difficulty urinating.  Endocrine: Denies: hot or cold intolerance, sweats, changes in hair or nails, polyuria, polydipsia. Musculoskeletal: Denies myalgias, back pain, joint swelling, arthralgias and gait problem.  Skin: Denies pallor, rash and wound.  Neurological: Denies dizziness, seizures, syncope, weakness, light-headedness, numbness and headaches.  Hematological: Denies adenopathy. Easy bruising, personal or family bleeding history  Psychiatric/Behavioral: Denies suicidal ideation, mood changes, confusion, nervousness, sleep disturbance and agitation    Physical Exam: Vitals:   07/20/22 0804  BP: 120/84  Pulse: 70  Temp: 98.3 F (36.8 C)  TempSrc: Oral  SpO2: 96%  Weight: 191 lb 4.8 oz (86.8 kg)  Height: '5\' 1"'$  (1.549 m)    Body mass index is 36.15 kg/m.   Constitutional: NAD, calm, comfortable Eyes: PERRL, lids and conjunctivae normal ENMT: Mucous membranes are moist. Posterior pharynx clear of any exudate or lesions. Normal dentition. Tympanic membrane is pearly white, no erythema or bulging. Neck: normal, supple, no masses, no thyromegaly Respiratory: clear to auscultation  bilaterally, no wheezing, no crackles. Normal respiratory effort. No accessory muscle use.  Cardiovascular: Regular rate and rhythm, no murmurs / rubs / gallops. No extremity edema. 2+ pedal pulses. No carotid bruits.  Abdomen: no tenderness, no masses palpated. No hepatosplenomegaly. Bowel sounds positive.  Musculoskeletal: no clubbing / cyanosis. No joint deformity upper and lower extremities. Good ROM, no contractures. Normal muscle tone.  Skin: no rashes, lesions, ulcers. No  induration Neurologic: CN 2-12 grossly intact. Sensation intact, DTR normal. Strength 5/5 in all 4.  Psychiatric: Normal judgment and insight. Alert and oriented x 3. Normal mood.   [place] Office Visit from 07/20/2022 in [place] at [facility]  PHQ-9 Total Score 2         Impression and Plan:  Encounter for preventive health examination  - Plan: CBC with Differential/Platelet, Comprehensive metabolic panel, Hemoglobin A1c, Lipid panel, Vitamin B12 -Recommend routine eye and dental care. -Immunizations: First shingles vaccine administered today, all other immunizations are up-to-date -Healthy lifestyle discussed in detail. -Labs to be updated today. -Colon cancer screening: 05/2020 -Breast cancer screening: 06/2022 -Cervical cancer screening: 04/2022 -Lung cancer screening: Not applicable -Prostate cancer screening: Not applicable -DEXA: Not applicable  Need for shingles vaccine -Shingles vaccine administered today.  Vitamin D deficiency  - Plan: VITAMIN D 25 Hydroxy (Vit-D Deficiency, Fractures)  Hypothyroidism, unspecified type - Plan: TSH Hyperparathyroidism ([place]) -Followed by endocrine, on 75 mcg of levothyroxine.      Lelon Frohlich, MD [facility] at [facility]

## 2022-11-27 ENCOUNTER — Ambulatory Visit (INDEPENDENT_AMBULATORY_CARE_PROVIDER_SITE_OTHER): Payer: PRIVATE HEALTH INSURANCE

## 2022-11-27 DIAGNOSIS — Z23 Encounter for immunization: Secondary | ICD-10-CM | POA: Diagnosis not present

## 2022-12-10 ENCOUNTER — Ambulatory Visit (HOSPITAL_COMMUNITY)
Admission: EM | Admit: 2022-12-10 | Discharge: 2022-12-10 | Disposition: A | Discharge status: Home / Self Care | Payer: PRIVATE HEALTH INSURANCE | Attending: Sports Medicine | Admitting: Sports Medicine

## 2022-12-10 ENCOUNTER — Encounter (HOSPITAL_COMMUNITY): Payer: Self-pay | Admitting: Emergency Medicine

## 2022-12-10 DIAGNOSIS — M79605 Pain in left leg: Secondary | ICD-10-CM

## 2022-12-10 MED ORDER — METHYLPREDNISOLONE SODIUM SUCC 125 MG IJ SOLR
60.0000 mg | Freq: Once | INTRAMUSCULAR | Status: AC
  Administered 2022-12-10: 60 mg via INTRAMUSCULAR

## 2022-12-10 MED ORDER — METHYLPREDNISOLONE ACETATE 80 MG/ML IJ SUSP
INTRAMUSCULAR | Status: AC
  Filled 2022-12-10: qty 1

## 2022-12-10 MED ORDER — KETOROLAC TROMETHAMINE 30 MG/ML IJ SOLN
INTRAMUSCULAR | Status: AC
  Filled 2022-12-10: qty 1

## 2022-12-10 MED ORDER — METHYLPREDNISOLONE SODIUM SUCC 125 MG IJ SOLR
INTRAMUSCULAR | Status: AC
  Filled 2022-12-10: qty 2

## 2022-12-10 MED ORDER — KETOROLAC TROMETHAMINE 30 MG/ML IJ SOLN
30.0000 mg | Freq: Once | INTRAMUSCULAR | Status: AC
  Administered 2022-12-10: 30 mg via INTRAMUSCULAR

## 2022-12-10 NOTE — ED Provider Notes (Signed)
[place]    CSN: [id number] Arrival date & time: 12/10/22  0803      History   Chief Complaint Chief Complaint  Patient presents with   Back Pain   Leg Pain    HPI Natasha Wells is a 51 y.o. female.   She presents today with chief complaint of left-sided back pain radiating down the back of her leg.  She reports she has had something similar in the past that got better when she came and got a shot.  She reports her pain has been pretty sharp for the past week but worsened yesterday.  She has been trying ibuprofen and IcyHot patches with only temporary relief.  She denies any injuries to the area, numbness and tingling or loss of bowel or bladder control.  She has not taken any medication this morning, denies history of diabetes.   Back Pain Associated symptoms: leg pain   Leg Pain Associated symptoms: back pain     Past Medical History:  Diagnosis Date   Allergic rhinitis    Allergy    Anemia    Birth control    condoms   Thyroid disease    Vitamin D deficiency     Patient Active Problem List   Diagnosis Date Noted   Vitamin D deficiency 01/08/2017   Amenorrhea, secondary 12/17/2014   Lumbago 12/17/2014   Bilateral carpal tunnel syndrome 08/24/2014   Hypothyroidism 08/22/2013   Hyperparathyroidism ([place]) 08/29/2012   Menstrual migraine 08/16/2012   Weight gain 08/16/2012   Hyperkalemia 05/22/2011   General medical examination 03/09/2011   ALLERGIC RHINITIS 03/22/2009   LEUKOPENIA, MILD 02/29/2008   BACK PAIN 01/09/2008    Past Surgical History:  Procedure Laterality Date   CESAREAN SECTION     x 2    OB History     Gravida  2   Para  2   Term  2   Preterm      AB      Living  2      SAB      IAB      Ectopic      Multiple      Live Births  2            Home Medications    Prior to Admission medications   Medication Sig Start Date End Date Taking? Authorizing Provider  levothyroxine (SYNTHROID) 75 MCG  tablet Take 75 mcg by mouth daily. 03/30/22   [provider]  Vitamin D, Ergocalciferol, (DRISDOL) 1.25 MG (50000 UNIT) CAPS capsule Take 50,000 Units by mouth once a week. 04/24/22   [provider]    Family History Family History  Problem Relation Age of Onset   Lung cancer Brother    Cancer Brother        lung cancer   Leukemia Other        nephew   Cancer Other        nephew, leukemia    Hypertension Neg Hx    Stroke Neg Hx    Colon cancer Neg Hx    Esophageal cancer Neg Hx    Rectal cancer Neg Hx    Stomach cancer Neg Hx     Social History Social History   Tobacco Use   Smoking status: Never   Smokeless tobacco: Never  Vaping Use   Vaping Use: Never used  Substance Use Topics   Alcohol use: No    Alcohol/week: 0.0 standard drinks of alcohol  Drug use: No     Allergies   Patient has no known allergies.   Review of Systems Review of Systems  Musculoskeletal:  Positive for back pain.  Rest as listed above in HPI   Physical Exam Triage Vital Signs ED Triage Vitals  Enc Vitals Group     BP 12/10/22 0828 135/83     Pulse Rate 12/10/22 0828 74     Resp 12/10/22 0828 18     Temp 12/10/22 0828 97.8 F (36.6 C)     Temp Source 12/10/22 0828 Oral     SpO2 12/10/22 0828 95 %     Weight --      Height --      Head Circumference --      Peak Flow --      Pain Score 12/10/22 0827 10     Pain Loc --      Pain Edu? --      Excl. in [place]? --    No data found.  Updated Vital Signs BP 135/83 (BP Location: Right Arm)   Pulse 74   Temp 97.8 F (36.6 C) (Oral)   Resp 18   LMP 12/05/2022   SpO2 95%    Physical Exam Vitals reviewed.  Constitutional:      General: She is not in acute distress.    Appearance: Normal appearance. She is obese. She is not ill-appearing, toxic-appearing or diaphoretic.  Cardiovascular:     Rate and Rhythm: Normal rate.  Pulmonary:     Effort: Pulmonary effort is normal.  Musculoskeletal:     Comments:  Tenderness to palpation of the SI joint, piriformis muscle.  Negative seated straight leg raise, positive for pain.  Antalgic gait Left knee strength 5/5 with resisted flexion and extension  Skin:    General: Skin is warm.  Neurological:     Mental Status: She is alert.  Psychiatric:        Mood and Affect: Mood normal.        Behavior: Behavior normal.        Thought Content: Thought content normal.        Judgment: Judgment normal.      UC Treatments / Results  Labs (all labs ordered are listed, but only abnormal results are displayed) Labs Reviewed - No data to display  EKG   Radiology No results found.  Procedures Procedures (including critical care time)  Medications Ordered in UC Medications  ketorolac (TORADOL) 30 MG/ML injection 30 mg (has no administration in time range)  methylPREDNISolone sodium succinate (SOLU-MEDROL) 125 mg/2 mL injection 60 mg (has no administration in time range)    Initial Impression / Assessment and Plan / UC Course  I have reviewed the triage vital signs and the nursing notes.  Pertinent labs & imaging results that were available during my care of the patient were reviewed by me and considered in my medical decision making (see chart for details).     Patient is here for low back pain radiating down her left leg, back pain with radiculopathy.  Discussed options with her today she opted to have IM Toradol injection with IM steroid injection today.  I discussed with her good hydration for the next couple days and gentle exercises.  She may continue with her IcyHot patches for pain relief.  Recommend follow-up with her primary care provider in about a week if symptoms worsen or fail to improve.  She may need prescription for physical therapy or further imaging  at that time.  She verbalized understanding. Final Clinical Impressions(s) / UC Diagnoses   Final diagnoses:  Left leg pain     Discharge Instructions      For your back and  leg pain today we gave you 1 shot of Toradol for pain and inflammation and 1 steroid shot for pain and inflammation.  Do not take any ibuprofen for the rest of today and drink lots of water.  Begin the gentle exercises like a short walk then transition to the exercises I have provided.  You may continue to use your IcyHot patches you have at home.  Follow-up with your primary care provider.  Return to urgent care or see your primary care provider if symptoms worsen or fail to improve.     ED Prescriptions   None    PDMP not reviewed this encounter.   Elmore Guise, DO 12/10/22 [phone]

## 2022-12-10 NOTE — ED Triage Notes (Signed)
Pt c/o left lower back pains that radiates down left leg for week.

## 2022-12-10 NOTE — Discharge Instructions (Signed)
For your back and leg pain today we gave you 1 shot of Toradol for pain and inflammation and 1 steroid shot for pain and inflammation.  Do not take any ibuprofen for the rest of today and drink lots of water.  Begin the gentle exercises like a short walk then transition to the exercises I have provided.  You may continue to use your IcyHot patches you have at home.  Follow-up with your primary care provider.  Return to urgent care or see your primary care provider if symptoms worsen or fail to improve.

## 2022-12-21 ENCOUNTER — Ambulatory Visit (INDEPENDENT_AMBULATORY_CARE_PROVIDER_SITE_OTHER): Payer: PRIVATE HEALTH INSURANCE | Admitting: Internal Medicine

## 2022-12-21 ENCOUNTER — Encounter: Payer: Self-pay | Admitting: Internal Medicine

## 2022-12-21 VITALS — BP 120/84 | HR 75 | Temp 98.0°F | Wt 190.4 lb

## 2022-12-21 DIAGNOSIS — G8929 Other chronic pain: Secondary | ICD-10-CM

## 2022-12-21 DIAGNOSIS — M5442 Lumbago with sciatica, left side: Secondary | ICD-10-CM | POA: Diagnosis not present

## 2022-12-21 MED ORDER — KETOROLAC TROMETHAMINE 60 MG/2ML IM SOLN
60.0000 mg | Freq: Once | INTRAMUSCULAR | Status: AC
  Administered 2022-12-21: 60 mg via INTRAMUSCULAR

## 2022-12-21 MED ORDER — PREDNISONE 10 MG (21) PO TBPK
ORAL_TABLET | ORAL | 0 refills | Status: DC
Start: 2022-12-21 — End: 2023-11-01

## 2022-12-21 NOTE — Progress Notes (Signed)
Established Patient Office Visit     CC/Reason for Visit: ED follow-up, continued low back and left leg pain  HPI: Annai Heick is a 52 y.o. female who is coming in today for the above mentioned reasons.  She was seen in the emergency department on 12/28 for low back pain with left-sided sciatica.  She was given Toradol and some back stretches and sent home.  She improved somewhat but feels like pain is returning.  She does not have bowel or bladder incontinence, no saddle anesthesia.   Past Medical/Surgical History: Past Medical History:  Diagnosis Date   Allergic rhinitis    Allergy    Anemia    Birth control    condoms   Thyroid disease    Vitamin D deficiency     Past Surgical History:  Procedure Laterality Date   CESAREAN SECTION     x 2    Social History:  reports that she has never smoked. She has never used smokeless tobacco. She reports that she does not drink alcohol and does not use drugs.  Allergies: No Known Allergies  Family History:  Family History  Problem Relation Age of Onset   Lung cancer Brother    Cancer Brother        lung cancer   Leukemia Other        nephew   Cancer Other        nephew, leukemia    Hypertension Neg Hx    Stroke Neg Hx    Colon cancer Neg Hx    Esophageal cancer Neg Hx    Rectal cancer Neg Hx    Stomach cancer Neg Hx      Current Outpatient Medications:    levothyroxine (SYNTHROID) 75 MCG tablet, Take 75 mcg by mouth daily., Disp: , Rfl:    predniSONE (STERAPRED UNI-PAK 21 TAB) 10 MG (21) TBPK tablet, Take as directed, Disp: 21 tablet, Rfl: 0   Vitamin D, Ergocalciferol, (DRISDOL) 1.25 MG (50000 UNIT) CAPS capsule, Take 50,000 Units by mouth once a week., Disp: , Rfl:   Current Facility-Administered Medications:    ketorolac (TORADOL) injection 60 mg, 60 mg, Intramuscular, Once, Isaac Bliss, Rayford Halsted, MD  Review of Systems:  Constitutional: Denies fever, chills, diaphoresis, appetite change and  fatigue.  HEENT: Denies photophobia, eye pain, redness, hearing loss, ear pain, congestion, sore throat, rhinorrhea, sneezing, mouth sores, trouble swallowing, neck pain, neck stiffness and tinnitus.   Respiratory: Denies SOB, DOE, cough, chest tightness,  and wheezing.   Cardiovascular: Denies chest pain, palpitations and leg swelling.  Gastrointestinal: Denies nausea, vomiting, abdominal pain, diarrhea, constipation, blood in stool and abdominal distention.  Genitourinary: Denies dysuria, urgency, frequency, hematuria, flank pain and difficulty urinating.  Endocrine: Denies: hot or cold intolerance, sweats, changes in hair or nails, polyuria, polydipsia. Musculoskeletal: Denies myalgias, joint swelling, arthralgias and gait problem.  Skin: Denies pallor, rash and wound.  Neurological: Denies dizziness, seizures, syncope, weakness, light-headedness and headaches.  Hematological: Denies adenopathy. Easy bruising, personal or family bleeding history  Psychiatric/Behavioral: Denies suicidal ideation, mood changes, confusion, nervousness, sleep disturbance and agitation    Physical Exam: Vitals:   12/21/22 1142  BP: 120/84  Pulse: 75  Temp: 98 F (36.7 C)  TempSrc: Oral  SpO2: 98%  Weight: 190 lb 6.4 oz (86.4 kg)    Body mass index is 35.98 kg/m.   Constitutional: NAD, calm, comfortable Eyes: PERRL, lids and conjunctivae normal ENMT: Mucous membranes are moist.   Psychiatric: Normal  judgment and insight. Alert and oriented x 3. Normal mood.    Impression and Plan:  Chronic left-sided low back pain with left-sided sciatica - Plan: ketorolac (TORADOL) injection 60 mg, predniSONE (STERAPRED UNI-PAK 21 TAB) 10 MG (21) TBPK tablet, Ambulatory referral to Physical Therapy  -Will give IM Toradol today, prednisone taper and referral to physical therapy, if not improved consider imaging.  Time spent:33 minutes reviewing chart, interviewing and examining patient and formulating plan of  care.      Lelon Frohlich, MD [facility]

## 2022-12-23 ENCOUNTER — Ambulatory Visit: Payer: PRIVATE HEALTH INSURANCE | Attending: Internal Medicine | Admitting: Physical Therapy

## 2022-12-23 ENCOUNTER — Other Ambulatory Visit: Payer: Self-pay

## 2022-12-23 ENCOUNTER — Encounter: Payer: Self-pay | Admitting: Physical Therapy

## 2022-12-23 DIAGNOSIS — M5416 Radiculopathy, lumbar region: Secondary | ICD-10-CM | POA: Insufficient documentation

## 2022-12-23 DIAGNOSIS — M5442 Lumbago with sciatica, left side: Secondary | ICD-10-CM | POA: Diagnosis not present

## 2022-12-23 DIAGNOSIS — M5459 Other low back pain: Secondary | ICD-10-CM

## 2022-12-23 DIAGNOSIS — G8929 Other chronic pain: Secondary | ICD-10-CM | POA: Diagnosis present

## 2022-12-23 DIAGNOSIS — M6281 Muscle weakness (generalized): Secondary | ICD-10-CM | POA: Insufficient documentation

## 2022-12-23 NOTE — Therapy (Signed)
OUTPATIENT PHYSICAL THERAPY THORACOLUMBAR EVALUATION   Patient Name: Natasha Wells MRN: [id number] DOB:[birth year], 52 y.o., female Today's Date: 12/23/2022  END OF SESSION:  PT End of Session - 12/23/22 1122     Visit Number 1    Date for PT Re-Evaluation 02/17/23    Authorization Type [employer]    PT Start Time 1015    PT Stop Time 1055    PT Time Calculation (min) 40 min    Activity Tolerance Patient tolerated treatment well    Behavior During Therapy WFL for tasks assessed/performed             Past Medical History:  Diagnosis Date   Allergic rhinitis    Allergy    Anemia    Birth control    condoms   Thyroid disease    Vitamin D deficiency    Past Surgical History:  Procedure Laterality Date   CESAREAN SECTION     x 2   Patient Active Problem List   Diagnosis Date Noted   Vitamin D deficiency 01/08/2017   Amenorrhea, secondary 12/17/2014   Lumbago 12/17/2014   Bilateral carpal tunnel syndrome 08/24/2014   Hypothyroidism 08/22/2013   Hyperparathyroidism ([place]) 08/29/2012   Menstrual migraine 08/16/2012   Weight gain 08/16/2012   Hyperkalemia 05/22/2011   General medical examination 03/09/2011   ALLERGIC RHINITIS 03/22/2009   LEUKOPENIA, MILD 02/29/2008   BACK PAIN 01/09/2008    PCP: Isaac Bliss, Rayford Halsted, MD  REFERRING PROVIDER: Isaac Bliss, Rayford Halsted, MD  REFERRING DIAG: [phone] (ICD-10-CM) - Chronic left-sided low back pain with left-sided sciatica  Rationale for Evaluation and Treatment: Rehabilitation  THERAPY DIAG:  Radiculopathy, lumbar region  Other low back pain  Muscle weakness (generalized)  ONSET DATE: 4 years ago the first time, this episode middle of Dec  SUBJECTIVE:                                                                                                                                                                                           SUBJECTIVE STATEMENT: Pt is Spanish speaking - has interpreter  from [facility] here today to translate.  Pain started middle of Dec.  I may have lifted something heavy to cause it.  Pt presented to ED 12/10/22 and received Tordol and steroid shots for Lt sided LBP with Lt UE radicular pain.  She received a 2nd Tordol shot from referring PCP last week.  She has had similar episodes in past which were relieved by injections.  This time pain can reach my heel which hadn't happened before.  Pain has improved since onset.  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  Pain can be worse sometimes when I lay down.  PERTINENT HISTORY:  Has had episodes of similar pain which were relieved by injections in past Has had 2 Tordol shots and a steroid shot for this episode to date (first round 12/10/22 in ED)  PAIN:  PAIN:  Are you having pain? Yes NPRS scale: 5/10 Pain location: Lt buttock and posterior thigh, plantar aspect of heel Pain orientation: Left and Posterior  Pain description: intermittent  Aggravating factors: when I work a lot of hours, works in Proofreader, Pensions consultant down causes more pain Relieving factors: when I take meds and put a patch on   PRECAUTIONS: None  WEIGHT BEARING RESTRICTIONS: No  FALLS:  Has patient fallen in last 6 months? 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Proofreader  PLOF: Independent  PATIENT GOALS: less pain  NEXT MD VISIT: as needed  OBJECTIVE:   DIAGNOSTIC FINDINGS:  none  PATIENT SURVEYS:  Modified Oswestry  12/50, mild disability (given in [place])   SCREENING FOR RED FLAGS: Bowel or bladder incontinence: No Spinal tumors: No Cauda equina syndrome: No Compression fracture: No Abdominal aneurysm: No  COGNITION: Overall cognitive status: Within functional limits for tasks assessed     SENSATION: WFL  MUSCLE LENGTH: Hamstrings:  WNL, end range tightness on Lt no pain on stretch   POSTURE: increased lumbar lordosis, mild sway back  PALPATION: Tender Lt SI joint, piriformis, gluteals, L5/S1 facet on Lt Tight non-tender lower thoracic paraspinals  LUMBAR ROM:   AROM eval  Flexion Full Lt buttock pain  Extension Full, relief  Right lateral flexion Full, Lt sided pain  Left lateral flexion Full, right sided pain  Right rotation WNL  Left rotation WNL   (Blank rows = not tested)  LOWER EXTREMITY ROM:     Passive  Right eval Left eval  Hip flexion 110 110  Hip extension    Hip abduction full full  Hip adduction    Hip internal rotation    Hip external rotation 60 55  Knee flexion    Knee extension    Ankle dorsiflexion    Ankle plantarflexion    Ankle inversion    Ankle eversion     (Blank rows = not tested)  LOWER EXTREMITY MMT:   Eval 12/23/22:  5/5 throughout bil LE with exception of Lt hip abd and ER 4/5 Core 4-/5  LUMBAR SPECIAL TESTS:  Straight leg raise test: Negative  FUNCTIONAL TESTS:  5 times sit to stand: 13 sec, rigid back position SLS: Fair balance with mild pain in Lt SLS, limited gluteal activation  GAIT: Distance walked: within clinic Assistive device utilized: None Level of assistance: Complete Independence Comments: wide stance and slight out-toeing  TODAY'S TREATMENT:  DATE: 12/23/22  HEP initated (see below) - printed in Spanish Discussed whether Pt needed interpreter for future appointments - did not feel she needed one  PATIENT EDUCATION:  Education details: Access Code: [email or web address] Person educated: Patient Education method: Explanation, Demonstration, Verbal cues, and Handouts Education comprehension: verbalized understanding and returned demonstration  HOME EXERCISE PROGRAM: Access Code: [email or web address] URL:  [email or web address] Date: 12/23/2022 Prepared by: Venetia Night Meoshia Billing  Exercises - Seated Hamstring Stretch  - 2 x daily - 7 x weekly - 1 sets - 3 reps - 20 hold - Standing Lumbar Extension  - 5 x daily - 7 x weekly - 1 sets - 10 reps - 5 hold - Standing Lumbar Extension with Counter  - 5 x daily - 7 x weekly - 1 sets - 10 reps - 5 hold - Standing Lumbar Extension at [place]  - 5 x daily - 7 x weekly - 1 sets - 10 reps - 5 hold - Supine Hamstring Stretch  - 1 x daily - 7 x weekly - 2 reps - 20 hold - Supine Figure 4 Piriformis Stretch  - 1 x daily - 7 x weekly - 1 sets - 2 reps - 20 hold - Supine Piriformis Stretch with Foot on Ground  - 1 x daily - 7 x weekly - 1 sets - 2 reps - 20 hold - Supine Bridge  - 1 x daily - 7 x weekly - 1 sets - 10 reps  ASSESSMENT:  CLINICAL IMPRESSION: Patient is a 52 y.o. female who was seen today for physical therapy evaluation and treatment for Lt LBP with LE pain extending from buttock to posterior thigh and occassionally reaching plantar aspect of heel.  She has had episodes of pain like this starting 4 years ago which do improve with injections.  She has had 3 injections to date since pain started in mid-Dec for this episode.  Pain has improved since onset and is rated 5/10.  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  She has nearly full ROM throughout trunk with min pain.  LE strength is 5/5 throughout.  She has local pain around Lt L5/S1 facet and SI joint with associated tenderness and limited flexibility through Lt piriformis and gluteals.  SLR is negative and PT is unable to ellicit any radicular pain below the buttock today with tests.  Pt has a slight preference into lumbar extension and did report relief with prone manual traction through Lt LE.  PT initiated gentle mobility and stretching through Lt hip and gave lumbar extension options to sprinkle in throughout the day to break up  bending/lifting job demands.  Pt needs improved core strength to support static and dynamic postures at work and home.  She will benefit from skilled PT to address findings, reduce pain, and maximize return to PLOF.   OBJECTIVE IMPAIRMENTS: decreased activity tolerance, decreased balance, decreased mobility, decreased ROM, decreased strength, increased muscle spasms, impaired flexibility, improper body mechanics, postural dysfunction, and pain.   ACTIVITY LIMITATIONS: carrying, lifting, bending, standing, and squatting  PARTICIPATION LIMITATIONS: cleaning, shopping, community activity, and occupation  PERSONAL FACTORS: Profession and Time since onset of injury/illness/exacerbation are also affecting patient's functional outcome.   REHAB POTENTIAL: Excellent  CLINICAL DECISION MAKING: Stable/uncomplicated  EVALUATION COMPLEXITY: Low   GOALS: Goals reviewed with patient? Yes  SHORT TERM GOALS: Target date: 01/20/23  Pt will be ind with initial HEP Baseline: Goal status: INITIAL  2.  Pt will report  at least 20% less pain on the job. Baseline:  Goal status: INITIAL  3.  Pt will learn how to activate deep core in static postures and with functional movements. Baseline:  Goal status: INITIAL  4.  Pt will be educated on proper body mechanics for job tasks. Baseline:  Goal status: INITIAL    LONG TERM GOALS: Target date: 02/17/23  Pt will report at least 70% improvement in pain with job tasks. Baseline:  Goal status: INITIAL  2.  Pt will achieve full trunk ROM without end range pain for improved job task performance Baseline:  Goal status: INITIAL  3.  Pt will score </= 8/50 on ODI to demo less disability/improved function for daily and work tasks. Baseline: 12/50 Goal status: INITIAL  4.  Pt will achieve at least 4+/5 strength and good closed chain SLS balance in Lt LE hip abd and ER for improved hip stabilization with gait, transfers, stairs and squatting. Baseline:   Goal status: INITIAL  5.  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 Baseline:  Goal status: INITIAL    PLAN:  PT FREQUENCY: 1x/week  PT DURATION: 8 weeks  PLANNED INTERVENTIONS: Therapeutic exercises, Therapeutic activity, Neuromuscular re-education, Balance training, Patient/Family education, Self Care, Joint mobilization, Aquatic Therapy, Dry Needling, Electrical stimulation, Spinal mobilization, Cryotherapy, Moist heat, Taping, and Manual therapy.  PLAN FOR NEXT SESSION: review HEP, NuStep, Lt SLS clock taps, intro core and progress as able, trunk extensor and Lt hip strength, body mechanics training for bend/lift/carry (works in Fish farm manager and moving boxes)   [employer], PT 12/23/22 11:23 AM

## 2022-12-25 ENCOUNTER — Ambulatory Visit: Payer: PRIVATE HEALTH INSURANCE

## 2023-01-06 ENCOUNTER — Ambulatory Visit: Payer: PRIVATE HEALTH INSURANCE

## 2023-01-06 DIAGNOSIS — G8929 Other chronic pain: Secondary | ICD-10-CM | POA: Diagnosis not present

## 2023-01-06 DIAGNOSIS — M5459 Other low back pain: Secondary | ICD-10-CM

## 2023-01-06 DIAGNOSIS — M5416 Radiculopathy, lumbar region: Secondary | ICD-10-CM

## 2023-01-06 DIAGNOSIS — M6281 Muscle weakness (generalized): Secondary | ICD-10-CM

## 2023-01-06 NOTE — Therapy (Signed)
OUTPATIENT PHYSICAL THERAPY THORACOLUMBAR EVALUATION   Patient Name: Natasha Wells MRN: [id number] DOB:[birth year], 53 y.o., female Today's Date: 01/06/2023  END OF SESSION:  PT End of Session - 01/06/23 1538     Visit Number 2    Date for PT Re-Evaluation 02/17/23    Authorization Type [employer]    PT Start Time 1530    PT Stop Time 1615    PT Time Calculation (min) 45 min    Activity Tolerance Patient tolerated treatment well    Behavior During Therapy [facility] for tasks assessed/performed             Past Medical History:  Diagnosis Date   Allergic rhinitis    Allergy    Anemia    Birth control    condoms   Thyroid disease    Vitamin D deficiency    Past Surgical History:  Procedure Laterality Date   CESAREAN SECTION     x 2   Patient Active Problem List   Diagnosis Date Noted   Vitamin D deficiency 01/08/2017   Amenorrhea, secondary 12/17/2014   Lumbago 12/17/2014   Bilateral carpal tunnel syndrome 08/24/2014   Hypothyroidism 08/22/2013   Hyperparathyroidism ([place]) 08/29/2012   Menstrual migraine 08/16/2012   Weight gain 08/16/2012   Hyperkalemia 05/22/2011   General medical examination 03/09/2011   ALLERGIC RHINITIS 03/22/2009   LEUKOPENIA, MILD 02/29/2008   BACK PAIN 01/09/2008    PCP: Isaac Bliss, Rayford Halsted, MD  REFERRING PROVIDER: Isaac Bliss, Rayford Halsted, MD  REFERRING DIAG: [phone] (ICD-10-CM) - Chronic left-sided low back pain with left-sided sciatica  Rationale for Evaluation and Treatment: Rehabilitation  THERAPY DIAG:  Radiculopathy, lumbar region  Other low back pain  Muscle weakness (generalized)  ONSET DATE: 4 years ago the first time, this episode middle of Dec  SUBJECTIVE:                                                                                                                                                                                           SUBJECTIVE STATEMENT: Pt is Spanish speaking - has interpreter  from [facility] here today to translate.  Patient reports she had some mild relief with the initial exercises.  Pain level 3/10 today.  She had injection and some medication prescribed prior to starting PT.  She feels like this helped  a lot.    PERTINENT HISTORY:  Has had episodes of similar pain which were relieved by injections in past Has had 2 Tordol shots and a steroid shot for this episode to date (first round 12/10/22 in ED)  PAIN:  PAIN:  Are you having pain? Yes NPRS  scale: 5/10 Pain location: Lt buttock and posterior thigh, plantar aspect of heel Pain orientation: Left and Posterior  Pain description: intermittent  Aggravating factors: when I work a lot of hours, works in [employer], Pensions consultant down causes more pain Relieving factors: when I take meds and put a patch on   PRECAUTIONS: None  WEIGHT BEARING RESTRICTIONS: No  FALLS:  Has patient fallen in last 6 months? 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[employer]  PLOF: Independent  PATIENT GOALS: less pain  NEXT MD VISIT: as needed  OBJECTIVE:   DIAGNOSTIC FINDINGS:  none  PATIENT SURVEYS:  Modified Oswestry  12/50, mild disability (given in [place])   SCREENING FOR RED FLAGS: Bowel or bladder incontinence: No Spinal tumors: No Cauda equina syndrome: No Compression fracture: No Abdominal aneurysm: No  COGNITION: Overall cognitive status: Within functional limits for tasks assessed     SENSATION: WFL  MUSCLE LENGTH: Hamstrings: WNL, end range tightness on Lt no pain on stretch   POSTURE: increased lumbar lordosis, mild sway back  PALPATION: Tender Lt SI joint, piriformis, gluteals, L5/S1 facet on Lt Tight non-tender lower thoracic paraspinals  LUMBAR ROM:   AROM eval  Flexion Full Lt buttock pain  Extension Full, relief  Right lateral  flexion Full, Lt sided pain  Left lateral flexion Full, right sided pain  Right rotation WNL  Left rotation WNL   (Blank rows = not tested)  LOWER EXTREMITY ROM:     Passive  Right eval Left eval  Hip flexion 110 110  Hip extension    Hip abduction full full  Hip adduction    Hip internal rotation    Hip external rotation 60 55  Knee flexion    Knee extension    Ankle dorsiflexion    Ankle plantarflexion    Ankle inversion    Ankle eversion     (Blank rows = not tested)  LOWER EXTREMITY MMT:   Eval 12/23/22:  5/5 throughout bil LE with exception of Lt hip abd and ER 4/5 Core 4-/5  LUMBAR SPECIAL TESTS:  Straight leg raise test: Negative  FUNCTIONAL TESTS:  5 times sit to stand: 13 sec, rigid back position SLS: Fair balance with mild pain in Lt SLS, limited gluteal activation  GAIT: Distance walked: within clinic Assistive device utilized: None Level of assistance: Complete Independence Comments: wide stance and slight out-toeing  TODAY'S TREATMENT:                                                                                                                              DATE: 01/06/23 Nustep x 5 min level 1 (PT present to discuss status and goals) Reviewed HEP Initated IT band stretch 3 x 30 sec both PPT x 20 with mod verbal cues PPT with 90/90 heel tap x 20 with mod verbal  cues for maintaining pelvic tilt PPT with dying bug x 20 with mod verbal cues Ice to lower left lumbar area x 10 min at end of session in hook lying with legs elevated on wedge  DATE: 12/23/22  HEP initated (see below) - printed in Spanish Discussed whether Pt needed interpreter for future appointments - did not feel she needed one  PATIENT EDUCATION:  Education details: Access Code: [email or web address] Person educated: Patient Education method: Explanation, Demonstration, Verbal cues, and Handouts Education comprehension: verbalized understanding and returned demonstration  HOME EXERCISE  PROGRAM: Access Code: [email or web address] URL: [email or web address] Date: 01/06/2023 Prepared by: Candyce Churn  Exercises - Seated Hamstring Stretch  - 2 x daily - 7 x weekly - 1 sets - 3 reps - 20 hold - Standing Lumbar Extension  - 5 x daily - 7 x weekly - 1 sets - 10 reps - 5 hold - Standing Lumbar Extension with Counter  - 5 x daily - 7 x weekly - 1 sets - 10 reps - 5 hold - Standing Lumbar Extension at [place]  - 5 x daily - 7 x weekly - 1 sets - 10 reps - 5 hold - Supine Hamstring Stretch  - 1 x daily - 7 x weekly - 2 reps - 20 hold - Supine Figure 4 Piriformis Stretch  - 1 x daily - 7 x weekly - 1 sets - 2 reps - 20 hold - Supine Piriformis Stretch with Foot on Ground  - 1 x daily - 7 x weekly - 1 sets - 2 reps - 20 hold - Supine Bridge  - 1 x daily - 7 x weekly - 1 sets - 10 reps - Supine ITB Stretch with Strap  - 1 x daily - 7 x weekly - 1 sets - 3 reps - 20 sec hold - Supine Posterior Pelvic Tilt  - 1 x daily - 7 x weekly - 3 sets - 10 reps - Supine 90/90 Alternating Heel Touches with Posterior Pelvic Tilt  - 1 x daily - 7 x weekly - 3 sets - 10 reps ASSESSMENT:  CLINICAL IMPRESSION: Joseph reported slight decrease in pain after initial HEP.  She was able to demonstrate these with moderate verbal cues today.  We added IT band stretch and initiated core strengthening exercises.  She did very well and had very little pain to speak of throughout. She should continue to do well.  She would benefit from continued skilled PT for LE flexibility, Mckenzie extension progression and core strengthening exercises.    OBJECTIVE IMPAIRMENTS: decreased activity tolerance, decreased balance, decreased mobility, decreased ROM, decreased strength, increased muscle spasms, impaired flexibility, improper body mechanics, postural dysfunction, and pain.   ACTIVITY LIMITATIONS: carrying, lifting, bending, standing, and squatting  PARTICIPATION LIMITATIONS: cleaning, shopping, community  activity, and occupation  PERSONAL FACTORS: Profession and Time since onset of injury/illness/exacerbation are also affecting patient's functional outcome.   REHAB POTENTIAL: Excellent  CLINICAL DECISION MAKING: Stable/uncomplicated  EVALUATION COMPLEXITY: Low   GOALS: Goals reviewed with patient? Yes  SHORT TERM GOALS: Target date: 01/20/23  Pt will be ind with initial HEP Baseline: Goal status: INITIAL  2.  Pt will report at least 20% less pain on the job. Baseline:  Goal status: INITIAL  3.  Pt will learn how to activate deep core in static postures and with functional movements. Baseline:  Goal status: INITIAL  4.  Pt will be educated on proper body mechanics for job tasks. Baseline:  Goal status: INITIAL  LONG TERM GOALS: Target date: 02/17/23  Pt will report at least 70% improvement in pain with job tasks. Baseline:  Goal status: INITIAL  2.  Pt will achieve full trunk ROM without end range pain for improved job task performance Baseline:  Goal status: INITIAL  3.  Pt will score </= 8/50 on ODI to demo less disability/improved function for daily and work tasks. Baseline: 12/50 Goal status: INITIAL  4.  Pt will achieve at least 4+/5 strength and good closed chain SLS balance in Lt LE hip abd and ER for improved hip stabilization with gait, transfers, stairs and squatting. Baseline:  Goal status: INITIAL  5.  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 Baseline:  Goal status: INITIAL    PLAN:  PT FREQUENCY: 1x/week  PT DURATION: 8 weeks  PLANNED INTERVENTIONS: Therapeutic exercises, Therapeutic activity, Neuromuscular re-education, Balance training, Patient/Family education, Self Care, Joint mobilization, Aquatic Therapy, Dry Needling, Electrical stimulation, Spinal mobilization, Cryotherapy, Moist heat, Taping, and Manual therapy.  PLAN FOR NEXT SESSION: NuStep, Lt SLS clock taps,  intro core and progress as able, trunk extensor and Lt hip strength, body mechanics training for bend/lift/carry (works in Fish farm manager and moving boxes)   [employer] B. Tasmin Exantus, PT 01/06/23 4:17 PM  [facility] [address], [place], [place] [address] Phone # [phone] Fax [phone]

## 2023-01-13 ENCOUNTER — Ambulatory Visit: Payer: PRIVATE HEALTH INSURANCE

## 2023-01-13 DIAGNOSIS — M5416 Radiculopathy, lumbar region: Secondary | ICD-10-CM

## 2023-01-13 DIAGNOSIS — G8929 Other chronic pain: Secondary | ICD-10-CM | POA: Diagnosis not present

## 2023-01-13 DIAGNOSIS — R252 Cramp and spasm: Secondary | ICD-10-CM

## 2023-01-13 DIAGNOSIS — M6281 Muscle weakness (generalized): Secondary | ICD-10-CM

## 2023-01-13 DIAGNOSIS — M5459 Other low back pain: Secondary | ICD-10-CM

## 2023-01-13 DIAGNOSIS — R262 Difficulty in walking, not elsewhere classified: Secondary | ICD-10-CM

## 2023-01-13 NOTE — Therapy (Signed)
OUTPATIENT PHYSICAL THERAPY THORACOLUMBAR EVALUATION   Patient Name: Natasha Wells MRN: [id number] DOB:[birth year], 52 y.o., female Today's Date: 01/13/2023  END OF SESSION:  PT End of Session - 01/13/23 1532     Visit Number 3    Date for PT Re-Evaluation 02/17/23    Authorization Type [employer]    PT Start Time 1532    PT Stop Time 1614    PT Time Calculation (min) 42 min    Activity Tolerance Patient tolerated treatment well    Behavior During Therapy WFL for tasks assessed/performed             Past Medical History:  Diagnosis Date   Allergic rhinitis    Allergy    Anemia    Birth control    condoms   Thyroid disease    Vitamin D deficiency    Past Surgical History:  Procedure Laterality Date   CESAREAN SECTION     x 2   Patient Active Problem List   Diagnosis Date Noted   Vitamin D deficiency 01/08/2017   Amenorrhea, secondary 12/17/2014   Lumbago 12/17/2014   Bilateral carpal tunnel syndrome 08/24/2014   Hypothyroidism 08/22/2013   Hyperparathyroidism ([place]) 08/29/2012   Menstrual migraine 08/16/2012   Weight gain 08/16/2012   Hyperkalemia 05/22/2011   General medical examination 03/09/2011   ALLERGIC RHINITIS 03/22/2009   LEUKOPENIA, MILD 02/29/2008   BACK PAIN 01/09/2008    PCP: Isaac Bliss, Rayford Halsted, MD  REFERRING PROVIDER: Isaac Bliss, Rayford Halsted, MD  REFERRING DIAG: [phone] (ICD-10-CM) - Chronic left-sided low back pain with left-sided sciatica  Rationale for Evaluation and Treatment: Rehabilitation  THERAPY DIAG:  Radiculopathy, lumbar region  Other low back pain  Muscle weakness (generalized)  Cramp and spasm  Difficulty in walking, not elsewhere classified  ONSET DATE: 4 years ago the first time, this episode middle of Dec  SUBJECTIVE:                                                                                                                                                                                            SUBJECTIVE STATEMENT: Pt is Spanish speaking -  interpreter about 5 min late today but her English is actually pretty good.  Patient reports she still has very little back pain. Just having left leg pain now.  A lot of this pain is behind the left knee.  Not really having the pain in the buttock and posterior thigh at all.     PERTINENT HISTORY:  Has had episodes of similar pain which were relieved by injections in past Has had 2 Tordol shots and a steroid shot  for this episode to date (first round 12/10/22 in ED)  PAIN:  PAIN:  Are you having pain? Yes NPRS scale: 5/10 Pain location: Lt buttock and posterior thigh, plantar aspect of heel Pain orientation: Left and Posterior  Pain description: intermittent  Aggravating factors: when I work a lot of hours, works in [employer], Pensions consultant down causes more pain Relieving factors: when I take meds and put a patch on   PRECAUTIONS: None  WEIGHT BEARING RESTRICTIONS: No  FALLS:  Has patient fallen in last 6 months? 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[employer]  PLOF: Independent  PATIENT GOALS: less pain  NEXT MD VISIT: as needed  OBJECTIVE:   DIAGNOSTIC FINDINGS:  none  PATIENT SURVEYS:  Modified Oswestry  12/50, mild disability (given in [place])   SCREENING FOR RED FLAGS: Bowel or bladder incontinence: No Spinal tumors: No Cauda equina syndrome: No Compression fracture: No Abdominal aneurysm: No  COGNITION: Overall cognitive status: Within functional limits for tasks assessed     SENSATION: WFL  MUSCLE LENGTH: Hamstrings: WNL, end range tightness on Lt no pain on stretch   POSTURE: increased lumbar lordosis, mild sway back  PALPATION: Tender Lt SI joint, piriformis, gluteals, L5/S1 facet on Lt Tight non-tender lower thoracic paraspinals  LUMBAR  ROM:   AROM eval  Flexion Full Lt buttock pain  Extension Full, relief  Right lateral flexion Full, Lt sided pain  Left lateral flexion Full, right sided pain  Right rotation WNL  Left rotation WNL   (Blank rows = not tested)  LOWER EXTREMITY ROM:     Passive  Right eval Left eval  Hip flexion 110 110  Hip extension    Hip abduction full full  Hip adduction    Hip internal rotation    Hip external rotation 60 55  Knee flexion    Knee extension    Ankle dorsiflexion    Ankle plantarflexion    Ankle inversion    Ankle eversion     (Blank rows = not tested)  LOWER EXTREMITY MMT:   Eval 12/23/22:  5/5 throughout bil LE with exception of Lt hip abd and ER 4/5 Core 4-/5  LUMBAR SPECIAL TESTS:  Straight leg raise test: Negative  FUNCTIONAL TESTS:  5 times sit to stand: 13 sec, rigid back position SLS: Fair balance with mild pain in Lt SLS, limited gluteal activation  GAIT: Distance walked: within clinic Assistive device utilized: None Level of assistance: Complete Independence Comments: wide stance and slight out-toeing  TODAY'S TREATMENT:                                                                                                                              DATE: 01/13/23 Nustep x 5 min level 1 (PT present to discuss status and goals) Seated hamstring stretch left side 3 x  30 sec Initiated Mckenzie prone progression : 2 min lying flat, 2 min prone on elbows, 10 press ups Initated IT band stretch 3 x 30 sec both PPT x 20 with mod verbal cues PPT with 90/90 heel tap x 20 with mod verbal cues for maintaining pelvic tilt PPT with dying bug x 20 with mod verbal cues Ice to lower left lumbar area x 10 min at end of session in hook lying with legs elevated on wedge  DATE: 01/06/23 Nustep x 5 min level 1 (PT present to discuss status and goals) Reviewed HEP Initated IT band stretch 3 x 30 sec both PPT x 20 with mod verbal cues PPT with 90/90 heel tap x 20 with mod  verbal cues for maintaining pelvic tilt PPT with dying bug x 20 with mod verbal cues Educated on proper lifting from floor using legs and keeping lumbar spine flat, keeping weight close to her body and transporting package keeping close to her body then approaching the surface to release the package close to her body. Avoid lifting from floor as much as possible while she is still getting the benefit from the [facility].   Ice to lower left lumbar area x 10 min at end of session in hook lying with legs elevated on wedge  DATE: 12/23/22  HEP initated (see below) - printed in Spanish Discussed whether Pt needed interpreter for future appointments - did not feel she needed one  PATIENT EDUCATION:  Education details: Access Code: [email or web address] Person educated: Patient Education method: Explanation, Demonstration, Verbal cues, and Handouts Education comprehension: verbalized understanding and returned demonstration  HOME EXERCISE PROGRAM: Access Code: [email or web address] URL: [email or web address] Date: 01/13/2023 Prepared by: Candyce Churn  Exercises - Seated Hamstring Stretch  - 2 x daily - 7 x weekly - 1 sets - 3 reps - 20 hold - Standing Lumbar Extension  - 5 x daily - 7 x weekly - 1 sets - 10 reps - 5 hold - Standing Lumbar Extension with Counter  - 5 x daily - 7 x weekly - 1 sets - 10 reps - 5 hold - Standing Lumbar Extension at [place]  - 5 x daily - 7 x weekly - 1 sets - 10 reps - 5 hold - Supine Hamstring Stretch  - 1 x daily - 7 x weekly - 2 reps - 20 hold - Supine Figure 4 Piriformis Stretch  - 1 x daily - 7 x weekly - 1 sets - 2 reps - 20 hold - Supine ITB Stretch with Strap  - 1 x daily - 7 x weekly - 1 sets - 3 reps - 20 sec hold - Supine Piriformis Stretch with Foot on Ground  - 1 x daily - 7 x weekly - 1 sets - 2 reps - 20 hold - Supine Posterior Pelvic Tilt  - 1 x daily - 7 x weekly - 1 sets - 20 reps - Supine 90/90 Alternating Heel Touches with Posterior Pelvic Tilt  - 1 x  daily - 7 x weekly - 1 sets - 20 reps - Supine Dead Bug with Leg Extension  - 1 x daily - 7 x weekly - 1 sets - 20 reps - Lying Prone  - 1 x daily - 7 x weekly - 1 sets - 1 reps - 2 min hold - Static Prone on Elbows  - 1 x daily - 7 x weekly - 1 sets - 1 reps - 2 min hold - Prone Push Ups on Forearms  -  1 x daily - 7 x weekly - 1 sets - 1 reps - 2 min hold  ASSESSMENT:  CLINICAL IMPRESSION: Yanet had some pain in the popliteal area upon entering clinic that was resolved with Mckenzie extension.  She was able to tolerate addition of dying bug as well.   She did very well and only had mild return of symptoms with core exercises. She should continue to do well.  She would benefit from continued skilled PT for LE flexibility, Mckenzie extension progression and core strengthening exercises.    OBJECTIVE IMPAIRMENTS: decreased activity tolerance, decreased balance, decreased mobility, decreased ROM, decreased strength, increased muscle spasms, impaired flexibility, improper body mechanics, postural dysfunction, and pain.   ACTIVITY LIMITATIONS: carrying, lifting, bending, standing, and squatting  PARTICIPATION LIMITATIONS: cleaning, shopping, community activity, and occupation  PERSONAL FACTORS: Profession and Time since onset of injury/illness/exacerbation are also affecting patient's functional outcome.   REHAB POTENTIAL: Excellent  CLINICAL DECISION MAKING: Stable/uncomplicated  EVALUATION COMPLEXITY: Low   GOALS: Goals reviewed with patient? Yes  SHORT TERM GOALS: Target date: 01/20/23  Pt will be ind with initial HEP Baseline: Goal status: INITIAL  2.  Pt will report at least 20% less pain on the job. Baseline:  Goal status: INITIAL  3.  Pt will learn how to activate deep core in static postures and with functional movements. Baseline:  Goal status: INITIAL  4.  Pt will be educated on proper body mechanics for job tasks. Baseline:  Goal status: INITIAL    LONG TERM  GOALS: Target date: 02/17/23  Pt will report at least 70% improvement in pain with job tasks. Baseline:  Goal status: INITIAL  2.  Pt will achieve full trunk ROM without end range pain for improved job task performance Baseline:  Goal status: INITIAL  3.  Pt will score </= 8/50 on ODI to demo less disability/improved function for daily and work tasks. Baseline: 12/50 Goal status: INITIAL  4.  Pt will achieve at least 4+/5 strength and good closed chain SLS balance in Lt LE hip abd and ER for improved hip stabilization with gait, transfers, stairs and squatting. Baseline:  Goal status: INITIAL  5.  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 Baseline:  Goal status: INITIAL    PLAN:  PT FREQUENCY: 1x/week  PT DURATION: 8 weeks  PLANNED INTERVENTIONS: Therapeutic exercises, Therapeutic activity, Neuromuscular re-education, Balance training, Patient/Family education, Self Care, Joint mobilization, Aquatic Therapy, Dry Needling, Electrical stimulation, Spinal mobilization, Cryotherapy, Moist heat, Taping, and Manual therapy.  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 Jannette Cotham, PT 01/13/23 4:17 PM  [facility] [address] [place], [place] [address] Phone # [phone] Fax [phone]

## 2023-01-20 ENCOUNTER — Ambulatory Visit: Payer: PRIVATE HEALTH INSURANCE

## 2023-01-27 ENCOUNTER — Ambulatory Visit: Payer: PRIVATE HEALTH INSURANCE | Attending: Internal Medicine

## 2023-01-27 DIAGNOSIS — M5416 Radiculopathy, lumbar region: Secondary | ICD-10-CM | POA: Diagnosis present

## 2023-01-27 DIAGNOSIS — M5459 Other low back pain: Secondary | ICD-10-CM | POA: Diagnosis present

## 2023-01-27 DIAGNOSIS — R252 Cramp and spasm: Secondary | ICD-10-CM

## 2023-01-27 DIAGNOSIS — R262 Difficulty in walking, not elsewhere classified: Secondary | ICD-10-CM | POA: Insufficient documentation

## 2023-01-27 DIAGNOSIS — M6281 Muscle weakness (generalized): Secondary | ICD-10-CM

## 2023-01-27 NOTE — Therapy (Signed)
OUTPATIENT PHYSICAL THERAPY THORACOLUMBAR EVALUATION   Patient Name: Natasha Wells MRN: [id number] DOB:[birth year], 52 y.o., female Today's Date: 01/27/2023  END OF SESSION:  PT End of Session - 01/27/23 1620     Visit Number 4    Date for PT Re-Evaluation 02/17/23    Authorization Type [employer]    PT Start Time 1620    PT Stop Time 1658    PT Time Calculation (min) 38 min    Activity Tolerance Patient tolerated treatment well    Behavior During Therapy WFL for tasks assessed/performed             Past Medical History:  Diagnosis Date   Allergic rhinitis    Allergy    Anemia    Birth control    condoms   Thyroid disease    Vitamin D deficiency    Past Surgical History:  Procedure Laterality Date   CESAREAN SECTION     x 2   Patient Active Problem List   Diagnosis Date Noted   Vitamin D deficiency 01/08/2017   Amenorrhea, secondary 12/17/2014   Lumbago 12/17/2014   Bilateral carpal tunnel syndrome 08/24/2014   Hypothyroidism 08/22/2013   Hyperparathyroidism ([place]) 08/29/2012   Menstrual migraine 08/16/2012   Weight gain 08/16/2012   Hyperkalemia 05/22/2011   General medical examination 03/09/2011   ALLERGIC RHINITIS 03/22/2009   LEUKOPENIA, MILD 02/29/2008   BACK PAIN 01/09/2008    PCP: Isaac Bliss, Rayford Halsted, MD  REFERRING PROVIDER: Isaac Bliss, Rayford Halsted, MD  REFERRING DIAG: [phone] (ICD-10-CM) - Chronic left-sided low back pain with left-sided sciatica  Rationale for Evaluation and Treatment: Rehabilitation  THERAPY DIAG:  Radiculopathy, lumbar region  Other low back pain  Muscle weakness (generalized)  Cramp and spasm  Difficulty in walking, not elsewhere classified  ONSET DATE: 4 years ago the first time, this episode middle of Dec  SUBJECTIVE:                                                                                                                                                                                            SUBJECTIVE STATEMENT: Pt is Spanish speaking -  interpreter about 5 min late today but her English is actually pretty good.  Patient reports she still has very little back pain. Just having left leg pain now.  A lot of this pain is behind the left knee.  Not really having the pain in the buttock and posterior thigh at all.     PERTINENT HISTORY:  Has had episodes of similar pain which were relieved by injections in past Has had 2 Tordol shots and a steroid shot  for this episode to date (first round 12/10/22 in ED)  PAIN:  PAIN:  Are you having pain? Yes NPRS scale: 5/10 Pain location: Lt buttock and posterior thigh, plantar aspect of heel Pain orientation: Left and Posterior  Pain description: intermittent  Aggravating factors: when I work a lot of hours, works in [employer], Pensions consultant down causes more pain Relieving factors: when I take meds and put a patch on   PRECAUTIONS: None  WEIGHT BEARING RESTRICTIONS: No  FALLS:  Has patient fallen in last 6 months? 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[employer]  PLOF: Independent  PATIENT GOALS: less pain  NEXT MD VISIT: as needed  OBJECTIVE:   DIAGNOSTIC FINDINGS:  none  PATIENT SURVEYS:  Modified Oswestry  12/50, mild disability (given in [place])   SCREENING FOR RED FLAGS: Bowel or bladder incontinence: No Spinal tumors: No Cauda equina syndrome: No Compression fracture: No Abdominal aneurysm: No  COGNITION: Overall cognitive status: Within functional limits for tasks assessed     SENSATION: WFL  MUSCLE LENGTH: Hamstrings: WNL, end range tightness on Lt no pain on stretch   POSTURE: increased lumbar lordosis, mild sway back  PALPATION: Tender Lt SI joint, piriformis, gluteals, L5/S1 facet on Lt Tight non-tender lower thoracic paraspinals  LUMBAR  ROM:   AROM eval  Flexion Full Lt buttock pain  Extension Full, relief  Right lateral flexion Full, Lt sided pain  Left lateral flexion Full, right sided pain  Right rotation WNL  Left rotation WNL   (Blank rows = not tested)  LOWER EXTREMITY ROM:     Passive  Right eval Left eval  Hip flexion 110 110  Hip extension    Hip abduction full full  Hip adduction    Hip internal rotation    Hip external rotation 60 55  Knee flexion    Knee extension    Ankle dorsiflexion    Ankle plantarflexion    Ankle inversion    Ankle eversion     (Blank rows = not tested)  LOWER EXTREMITY MMT:   Eval 12/23/22:  5/5 throughout bil LE with exception of Lt hip abd and ER 4/5 Core 4-/5  LUMBAR SPECIAL TESTS:  Straight leg raise test: Negative  FUNCTIONAL TESTS:  5 times sit to stand: 13 sec, rigid back position SLS: Fair balance with mild pain in Lt SLS, limited gluteal activation  GAIT: Distance walked: within clinic Assistive device utilized: None Level of assistance: Complete Independence Comments: wide stance and slight out-toeing  TODAY'S TREATMENT:                                                                                                                              DATE: 01/13/23 Nustep x 5 min level 1 (PT present to discuss status and goals) Seated hamstring stretch left side 3 x  30 sec Initiated Mckenzie prone progression : 2 min lying flat, 2 min prone on elbows, 10 press ups Initated IT band stretch 3 x 30 sec both PPT x 20 with mod verbal cues PPT with 90/90 heel tap x 20 with mod verbal cues for maintaining pelvic tilt PPT with dying bug x 20 with mod verbal cues Ice to lower left lumbar area x 10 min at end of session in hook lying with legs elevated on wedge  DATE: 01/06/23 Nustep x 5 min level 1 (PT present to discuss status and goals) Reviewed HEP Initated IT band stretch 3 x 30 sec both PPT x 20 with mod verbal cues PPT with 90/90 heel tap x 20 with mod  verbal cues for maintaining pelvic tilt PPT with dying bug x 20 with mod verbal cues Educated on proper lifting from floor using legs and keeping lumbar spine flat, keeping weight close to her body and transporting package keeping close to her body then approaching the surface to release the package close to her body. Avoid lifting from floor as much as possible while she is still getting the benefit from the [facility].   Ice to lower left lumbar area x 10 min at end of session in hook lying with legs elevated on wedge  DATE: 12/23/22  HEP initated (see below) - printed in Spanish Discussed whether Pt needed interpreter for future appointments - did not feel she needed one  PATIENT EDUCATION:  Education details: Access Code: [email or web address] Person educated: Patient Education method: Explanation, Demonstration, Verbal cues, and Handouts Education comprehension: verbalized understanding and returned demonstration  HOME EXERCISE PROGRAM: Access Code: [email or web address] URL: [email or web address] Date: 01/27/2023 Prepared by: Candyce Churn  Exercises - Standing Lumbar Extension  - 5 x daily - 7 x weekly - 1 sets - 10 reps - 5 hold - Standing Lumbar Extension with Counter  - 5 x daily - 7 x weekly - 1 sets - 10 reps - 5 hold - Standing Lumbar Extension at [place]  - 5 x daily - 7 x weekly - 1 sets - 10 reps - 5 hold - Standing Hamstring Stretch on Chair  - 1 x daily - 7 x weekly - 1 sets - 3 reps - 30 hold - Standing Quad Stretch with Table and Chair Support  - 1 x daily - 7 x weekly - 1 sets - 3 reps - 30 hold - Seated Piriformis Stretch with Trunk Bend  - 1 x daily - 7 x weekly - 3 sets - 10 reps - Supine ITB Stretch with Strap  - 1 x daily - 7 x weekly - 1 sets - 3 reps - 20 sec hold - Supine Posterior Pelvic Tilt  - 1 x daily - 7 x weekly - 1 sets - 20 reps - Supine 90/90 Alternating Heel Touches with Posterior Pelvic Tilt  - 1 x daily - 7 x weekly - 1 sets - 20 reps - Supine Dead Bug  with Leg Extension  - 1 x daily - 7 x weekly - 1 sets - 20 reps - Lying Prone  - 1 x daily - 7 x weekly - 1 sets - 1 reps - 2 min hold - Static Prone on Elbows  - 1 x daily - 7 x weekly - 1 sets - 1 reps - 2 min hold - Prone Push Ups on Forearms  - 1 x daily - 7 x weekly - 1 sets - 1 reps - 2 min  hold - Full Superman on Table  - 1 x daily - 7 x weekly - 2 sets - 10 reps - Prone Alternating Arm and Leg Lifts  - 1 x daily - 7 x weekly - 2 sets - 10 reps  ASSESSMENT:  CLINICAL IMPRESSION: Timesha is progressing very well and has very little pain or radicular symptoms to speak of.  She is independent and moderately compliant with her HEP.  She feels she may be ready for DC next visit.    She would benefit from one more visit for continued skilled PT for LE flexibility, Mckenzie extension progression and core strengthening exercises.    OBJECTIVE IMPAIRMENTS: decreased activity tolerance, decreased balance, decreased mobility, decreased ROM, decreased strength, increased muscle spasms, impaired flexibility, improper body mechanics, postural dysfunction, and pain.   ACTIVITY LIMITATIONS: carrying, lifting, bending, standing, and squatting  PARTICIPATION LIMITATIONS: cleaning, shopping, community activity, and occupation  PERSONAL FACTORS: Profession and Time since onset of injury/illness/exacerbation are also affecting patient's functional outcome.   REHAB POTENTIAL: Excellent  CLINICAL DECISION MAKING: Stable/uncomplicated  EVALUATION COMPLEXITY: Low   GOALS: Goals reviewed with patient? Yes  SHORT TERM GOALS: Target date: 01/20/23  Pt will be ind with initial HEP Baseline: Goal status: INITIAL  2.  Pt will report at least 20% less pain on the job. Baseline:  Goal status: INITIAL  3.  Pt will learn how to activate deep core in static postures and with functional movements. Baseline:  Goal status: INITIAL  4.  Pt will be educated on proper body mechanics for job tasks. Baseline:   Goal status: INITIAL    LONG TERM GOALS: Target date: 02/17/23  Pt will report at least 70% improvement in pain with job tasks. Baseline:  Goal status: INITIAL  2.  Pt will achieve full trunk ROM without end range pain for improved job task performance Baseline:  Goal status: INITIAL  3.  Pt will score </= 8/50 on ODI to demo less disability/improved function for daily and work tasks. Baseline: 12/50 Goal status: INITIAL  4.  Pt will achieve at least 4+/5 strength and good closed chain SLS balance in Lt LE hip abd and ER for improved hip stabilization with gait, transfers, stairs and squatting. Baseline:  Goal status: INITIAL  5.  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 Baseline:  Goal status: INITIAL    PLAN:  PT FREQUENCY: 1x/week  PT DURATION: 8 weeks  PLANNED INTERVENTIONS: Therapeutic exercises, Therapeutic activity, Neuromuscular re-education, Balance training, Patient/Family education, Self Care, Joint mobilization, Aquatic Therapy, Dry Needling, Electrical stimulation, Spinal mobilization, Cryotherapy, Moist heat, Taping, and Manual therapy.  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 Josmar Messimer, PT 01/27/23 5:00 PM  [facility] [address], [place], [place] [address] Phone # [phone] Fax [phone]

## 2023-02-02 ENCOUNTER — Ambulatory Visit: Payer: PRIVATE HEALTH INSURANCE

## 2023-02-02 DIAGNOSIS — M5416 Radiculopathy, lumbar region: Secondary | ICD-10-CM | POA: Diagnosis not present

## 2023-02-02 DIAGNOSIS — M5459 Other low back pain: Secondary | ICD-10-CM

## 2023-02-02 DIAGNOSIS — R262 Difficulty in walking, not elsewhere classified: Secondary | ICD-10-CM

## 2023-02-02 DIAGNOSIS — R252 Cramp and spasm: Secondary | ICD-10-CM

## 2023-02-02 DIAGNOSIS — M6281 Muscle weakness (generalized): Secondary | ICD-10-CM

## 2023-02-02 NOTE — Therapy (Signed)
OUTPATIENT PHYSICAL THERAPY THORACOLUMBAR EVALUATION   Patient Name: Natasha Wells MRN: [id number] DOB:October 23, 1971, 52 y.o., female Today's Date: 02/02/2023  END OF SESSION:  PT End of Session - 02/02/23 1621     Visit Number 5    Date for PT Re-Evaluation 02/17/23    Authorization Type [employer]    PT Start Time 1615    PT Stop Time 1648    PT Time Calculation (min) 33 min    Activity Tolerance Patient tolerated treatment well    Behavior During Therapy WFL for tasks assessed/performed             Past Medical History:  Diagnosis Date   Allergic rhinitis    Allergy    Anemia    Birth control    condoms   Thyroid disease    Vitamin D deficiency    Past Surgical History:  Procedure Laterality Date   CESAREAN SECTION     x 2   Patient Active Problem List   Diagnosis Date Noted   Vitamin D deficiency 01/08/2017   Amenorrhea, secondary 12/17/2014   Lumbago 12/17/2014   Bilateral carpal tunnel syndrome 08/24/2014   Hypothyroidism 08/22/2013   Hyperparathyroidism ([place]) 08/29/2012   Menstrual migraine 08/16/2012   Weight gain 08/16/2012   Hyperkalemia 05/22/2011   General medical examination 03/09/2011   ALLERGIC RHINITIS 03/22/2009   LEUKOPENIA, MILD 02/29/2008   BACK PAIN 01/09/2008    PCP: Isaac Bliss, Rayford Halsted, MD  REFERRING PROVIDER: Isaac Bliss, Rayford Halsted, MD  REFERRING DIAG: [phone] (ICD-10-CM) - Chronic left-sided low back pain with left-sided sciatica  Rationale for Evaluation and Treatment: Rehabilitation  THERAPY DIAG:  Radiculopathy, lumbar region  Muscle weakness (generalized)  Other low back pain  Cramp and spasm  Difficulty in walking, not elsewhere classified  ONSET DATE: 4 years ago the first time, this episode middle of Dec  SUBJECTIVE:                                                                                                                                                                                            SUBJECTIVE STATEMENT: Patient reports she still has very little back pain. Leg pain at posterior knee has also diminished.  Not really having the pain in the buttock and posterior thigh at all.   Back to work full time and having no issues.    PERTINENT HISTORY:  Has had episodes of similar pain which were relieved by injections in past Has had 2 Tordol shots and a steroid shot for this episode to date (first round 12/10/22 in ED)  PAIN:  PAIN:  Are you having pain?  Yes NPRS scale: 5/10 Pain location: Lt buttock and posterior thigh, plantar aspect of heel Pain orientation: Left and Posterior  Pain description: intermittent  Aggravating factors: when I work a lot of hours, works in [employer], Pensions consultant down causes more pain Relieving factors: when I take meds and put a patch on   PRECAUTIONS: None  WEIGHT BEARING RESTRICTIONS: No  FALLS:  Has patient fallen in last 6 months? 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[employer]  PLOF: Independent  PATIENT GOALS: less pain  NEXT MD VISIT: as needed  OBJECTIVE:   DIAGNOSTIC FINDINGS:  none  PATIENT SURVEYS:  Modified Oswestry  12/50, mild disability (given in Spanish)  02/02/23: 1/50 (given in Spanish)  SCREENING FOR RED FLAGS: Bowel or bladder incontinence: No Spinal tumors: No Cauda equina syndrome: No Compression fracture: No Abdominal aneurysm: No  COGNITION: Overall cognitive status: Within functional limits for tasks assessed     SENSATION: WFL  MUSCLE LENGTH: Hamstrings: WNL, end range tightness on Lt no pain on stretch   POSTURE: increased lumbar lordosis, mild sway back  PALPATION: Tender Lt SI joint, piriformis, gluteals, L5/S1 facet on Lt Tight non-tender lower thoracic paraspinals  LUMBAR ROM:   AROM eval 02/02/23  Flexion Full Lt buttock  pain WNL  No pain  Extension Full, relief WNL  Right lateral flexion Full, Lt sided pain WNL No pain  Left lateral flexion Full, right sided pain WNL No pain  Right rotation WNL WNL  Left rotation WNL WNL   (Blank rows = not tested)  LOWER EXTREMITY ROM:     Passive  Right eval Left eval Left 02/02/23  Hip flexion 110 110   Hip extension     Hip abduction full full   Hip adduction     Hip internal rotation     Hip external rotation 60 55 WFL  Knee flexion     Knee extension     Ankle dorsiflexion     Ankle plantarflexion     Ankle inversion     Ankle eversion      (Blank rows = not tested)  LOWER EXTREMITY MMT:   Eval 12/23/22:  5/5 throughout bil LE with exception of Lt hip abd and ER 4/5 Core 4-/5  02/02/23: All have improved to 5/5  LUMBAR SPECIAL TESTS:  Straight leg raise test: Negative  FUNCTIONAL TESTS:  EVAL: 5 times sit to stand: 13 sec, rigid back position SLS: Fair balance with mild pain in Lt SLS, limited gluteal activation  02/02/23: 5 times sit to stand: 8.25 sec SLS: Balance WNL without leg pain  GAIT: Distance walked: within clinic Assistive device utilized: None Level of assistance: Complete Independence Comments: wide stance and slight out-toeing  TODAY'S TREATMENT:  DATE: 02/02/23 Nustep x 5 min level 1 (PT present to discuss status and goals) Standing hamstring stretch both 2 x 30 sec Standing quad/hip flexor stretch 2 x 20 sec  Educated on workplace safety and modifications to current job routine including avoiding repetitive rotation to one side by placing packing box on opposite side for second half of the day  Reviewed HEP and assigned DC plan  DATE: 01/27/23 Nustep x 5 min level 1 (PT present to discuss status and goals) Standing hamstring and quad stretch 3 x 30 sec each LE at steps Reviewed Mckenzie prone  progression : 2 min lying flat, 2 min prone on elbows, 10 press ups PPT x 20 with mod verbal cues PPT with 90/90 heel tap x 20 with mod verbal cues for maintaining pelvic tilt PPT with dying bug x 20 with mod verbal cues Ice to lower left lumbar area x 10 min at end of session in hook lying with legs elevated on wedge  DATE: 01/13/23 Nustep x 5 min level 1 (PT present to discuss status and goals) Seated hamstring stretch left side 3 x 30 sec Initiated Mckenzie prone progression : 2 min lying flat, 2 min prone on elbows, 10 press ups Initated IT band stretch 3 x 30 sec both PPT x 20 with mod verbal cues PPT with 90/90 heel tap x 20 with mod verbal cues for maintaining pelvic tilt PPT with dying bug x 20 with mod verbal cues Ice to lower left lumbar area x 10 min at end of session in hook lying with legs elevated on wedge   PATIENT EDUCATION:  Education details: Access Code: [email or web address] Person educated: Patient Education method: Consulting civil engineer, Demonstration, Verbal cues, and Handouts Education comprehension: verbalized understanding and returned demonstration  HOME EXERCISE PROGRAM: Access Code: [email or web address] URL: [email or web address] Date: 01/27/2023 Prepared by: Candyce Churn  Exercises - Standing Lumbar Extension  - 5 x daily - 7 x weekly - 1 sets - 10 reps - 5 hold - Standing Lumbar Extension with Counter  - 5 x daily - 7 x weekly - 1 sets - 10 reps - 5 hold - Standing Lumbar Extension at [place]  - 5 x daily - 7 x weekly - 1 sets - 10 reps - 5 hold - Standing Hamstring Stretch on Chair  - 1 x daily - 7 x weekly - 1 sets - 3 reps - 30 hold - Standing Quad Stretch with Table and Chair Support  - 1 x daily - 7 x weekly - 1 sets - 3 reps - 30 hold - Seated Piriformis Stretch with Trunk Bend  - 1 x daily - 7 x weekly - 3 sets - 10 reps - Supine ITB Stretch with Strap  - 1 x daily - 7 x weekly - 1 sets - 3 reps - 20 sec hold - Supine Posterior Pelvic Tilt  - 1 x daily  - 7 x weekly - 1 sets - 20 reps - Supine 90/90 Alternating Heel Touches with Posterior Pelvic Tilt  - 1 x daily - 7 x weekly - 1 sets - 20 reps - Supine Dead Bug with Leg Extension  - 1 x daily - 7 x weekly - 1 sets - 20 reps - Lying Prone  - 1 x daily - 7 x weekly - 1 sets - 1 reps - 2 min hold - Static Prone on Elbows  - 1 x daily - 7 x weekly - 1 sets - 1 reps - 2  min hold - Prone Push Ups on Forearms  - 1 x daily - 7 x weekly - 1 sets - 1 reps - 2 min hold - Full Superman on Table  - 1 x daily - 7 x weekly - 2 sets - 10 reps - Prone Alternating Arm and Leg Lifts  - 1 x daily - 7 x weekly - 2 sets - 10 reps  ASSESSMENT:  CLINICAL IMPRESSION: Elenie has met all goals and agrees to DC.  She is compliant and well motivated. She should continue to do well.      OBJECTIVE IMPAIRMENTS: decreased activity tolerance, decreased balance, decreased mobility, decreased ROM, decreased strength, increased muscle spasms, impaired flexibility, improper body mechanics, postural dysfunction, and pain.   ACTIVITY LIMITATIONS: carrying, lifting, bending, standing, and squatting  PARTICIPATION LIMITATIONS: cleaning, shopping, community activity, and occupation  PERSONAL FACTORS: Profession and Time since onset of injury/illness/exacerbation are also affecting patient's functional outcome.   REHAB POTENTIAL: Excellent  CLINICAL DECISION MAKING: Stable/uncomplicated  EVALUATION COMPLEXITY: Low   GOALS: Goals reviewed with patient? Yes  SHORT TERM GOALS: Target date: 01/20/23  Pt will be ind with initial HEP Baseline: Goal status: MET  2.  Pt will report at least 20% less pain on the job. Baseline:  Goal status: MET  3.  Pt will learn how to activate deep core in static postures and with functional movements. Baseline:  Goal status: MET  4.  Pt will be educated on proper body mechanics for job tasks. Baseline:  Goal status: MET    LONG TERM GOALS: Target date: 02/17/23  Pt will report  at least 70% improvement in pain with job tasks. Baseline:  Goal status: MET  2.  Pt will achieve full trunk ROM without end range pain for improved job task performance Baseline:  Goal status: MET  3.  Pt will score </= 8/50 on ODI to demo less disability/improved function for daily and work tasks. Baseline: 12/50 Goal status: MET  4.  Pt will achieve at least 4+/5 strength and good closed chain SLS balance in Lt LE hip abd and ER for improved hip stabilization with gait, transfers, stairs and squatting. Baseline:  Goal status: MET  5.  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 Baseline:  Goal status: MET    PLAN:  PT FREQUENCY: 1x/week  PT DURATION: 8 weeks  PLANNED INTERVENTIONS: Therapeutic exercises, Therapeutic activity, Neuromuscular re-education, Balance training, Patient/Family education, Self Care, Joint mobilization, Aquatic Therapy, Dry Needling, Electrical stimulation, Spinal mobilization, Cryotherapy, Moist heat, Taping, and Manual therapy.  PLAN FOR NEXT SESSION: We will DC at this time.    [facility] B. Paton Crum, PT 02/02/23 5:00 PM  [facility] [address], [place], [place] [address] Phone # [phone] Fax [phone]

## 2023-02-16 ENCOUNTER — Encounter: Payer: PRIVATE HEALTH INSURANCE | Admitting: Physical Therapy

## 2023-04-26 LAB — LAB REPORT - SCANNED
A1c: 5.7
EGFR: 106

## 2023-05-11 ENCOUNTER — Encounter: Payer: Self-pay | Admitting: Obstetrics & Gynecology

## 2023-05-11 ENCOUNTER — Ambulatory Visit (INDEPENDENT_AMBULATORY_CARE_PROVIDER_SITE_OTHER): Payer: PRIVATE HEALTH INSURANCE | Admitting: Obstetrics & Gynecology

## 2023-05-11 VITALS — BP 132/86 | Ht 59.5 in | Wt 199.0 lb

## 2023-05-11 DIAGNOSIS — Z01419 Encounter for gynecological examination (general) (routine) without abnormal findings: Secondary | ICD-10-CM | POA: Diagnosis not present

## 2023-05-11 DIAGNOSIS — Z789 Other specified health status: Secondary | ICD-10-CM

## 2023-05-11 NOTE — Progress Notes (Signed)
Natasha Wells February 10, 1971 [id number]   History:    52 y.o. G2P2L2 Married.   Children doing well,  daughter 34 and son 56 yo.   RP:  Established patient presenting for annual gyn exam     HPI:  Menses every month lasting 5 days.  No BTB.  No pelvic pain. Normal secretions.  No pain with IC.  Recommend to continue using condoms.  No h/o abnormal Pap.  Last pap neg 04/2022.  Repeat at 3 years.  Urine/BMs wnl.  Breasts wnl.  Mammo Neg 07/2022.  Dr Talmage Nap following for Hypothyroidism/h/o Hyperparathyroidism.  Health labs with Fam MD.  Not very physically active.  BMI 39.52.  Colono 2021, repeat at 5 years.  BD normal in 2017.   Past medical history,surgical history, family history and social history were all reviewed and documented in the EPIC chart.  Gynecologic History Patient's last menstrual period was 04/12/2023 (approximate).  Obstetric History OB History  Gravida Para Term Preterm AB Living  2 2 2     2   SAB IAB Ectopic Multiple Live Births          2    # Outcome Date GA Lbr Len/2nd Weight Sex Delivery Anes PTL Lv  2 Term     F CS-Unspec  N LIV  1 Term     M CS-Unspec  N LIV     ROS: A ROS was performed and pertinent positives and negatives are included in the history. GENERAL: No fevers or chills. HEENT: No change in vision, no earache, sore throat or sinus congestion. NECK: No pain or stiffness. CARDIOVASCULAR: No chest pain or pressure. No palpitations. PULMONARY: No shortness of breath, cough or wheeze. GASTROINTESTINAL: No abdominal pain, nausea, vomiting or diarrhea, melena or bright red blood per rectum. GENITOURINARY: No urinary frequency, urgency, hesitancy or dysuria. MUSCULOSKELETAL: No joint or muscle pain, no back pain, no recent trauma. DERMATOLOGIC: No rash, no itching, no lesions. ENDOCRINE: No polyuria, polydipsia, no heat or cold intolerance. No recent change in weight. HEMATOLOGICAL: No anemia or easy bruising or bleeding. NEUROLOGIC: No headache, seizures,  numbness, tingling or weakness. PSYCHIATRIC: No depression, no loss of interest in normal activity or change in sleep pattern.     Exam:   BP 132/86 (BP Location: Left Arm, Patient Position: Sitting, Cuff Size: Large)   Ht 4' 11.5" (1.511 m)   Wt 199 lb (90.3 kg)   LMP 04/12/2023 (Approximate)   BMI 39.52 kg/m   Body mass index is 39.52 kg/m.  General appearance : Well developed well nourished female. No acute distress HEENT: Eyes: no retinal hemorrhage or exudates,  Neck supple, trachea midline, no carotid bruits, no thyroidmegaly Lungs: Clear to auscultation, no rhonchi or wheezes, or rib retractions  Heart: Regular rate and rhythm, no murmurs or gallops Breast:Examined in sitting and supine position were symmetrical in appearance, no palpable masses or tenderness,  no skin retraction, no nipple inversion, no nipple discharge, no skin discoloration, no axillary or supraclavicular lymphadenopathy Abdomen: no palpable masses or tenderness, no rebound or guarding Extremities: no edema or skin discoloration or tenderness  Pelvic: Vulva: Normal             Vagina: No gross lesions or discharge  Cervix: No gross lesions or discharge  Uterus  AV, normal size, shape and consistency, non-tender and mobile  Adnexa  Without masses or tenderness  Anus: Normal   Assessment/Plan:  52 y.o. female for annual exam   1. Well female exam with  routine gynecological exam  Menses every month lasting 5 days.  No BTB.  No pelvic pain. Normal secretions.  No pain with IC.  Recommend to continue using condoms.  No h/o abnormal Pap.  Last pap neg 04/2022.  Repeat at 3 years.  Urine/BMs wnl.  Breasts wnl.  Mammo Neg 07/2022.  Dr Talmage Nap following for Hypothyroidism/h/o Hyperparathyroidism.  Health labs with Fam MD.  Not very physically active.  BMI 39.52.  Colono 2021, repeat at 5 years.  BD normal in 2017.  2. Use of condoms for contraception    Genia Del MD, 4:18 PM

## 2023-07-23 ENCOUNTER — Encounter: Payer: PRIVATE HEALTH INSURANCE | Admitting: Internal Medicine

## 2023-11-01 ENCOUNTER — Ambulatory Visit (INDEPENDENT_AMBULATORY_CARE_PROVIDER_SITE_OTHER): Payer: PRIVATE HEALTH INSURANCE | Admitting: Internal Medicine

## 2023-11-01 ENCOUNTER — Encounter: Payer: Self-pay | Admitting: Internal Medicine

## 2023-11-01 VITALS — BP 120/80 | HR 64 | Temp 98.2°F | Ht 62.5 in | Wt 193.6 lb

## 2023-11-01 DIAGNOSIS — E66811 Obesity, class 1: Secondary | ICD-10-CM

## 2023-11-01 DIAGNOSIS — Z1159 Encounter for screening for other viral diseases: Secondary | ICD-10-CM

## 2023-11-01 DIAGNOSIS — M5442 Lumbago with sciatica, left side: Secondary | ICD-10-CM

## 2023-11-01 DIAGNOSIS — E039 Hypothyroidism, unspecified: Secondary | ICD-10-CM

## 2023-11-01 DIAGNOSIS — Z0000 Encounter for general adult medical examination without abnormal findings: Secondary | ICD-10-CM | POA: Diagnosis not present

## 2023-11-01 DIAGNOSIS — G8929 Other chronic pain: Secondary | ICD-10-CM

## 2023-11-01 DIAGNOSIS — Z1231 Encounter for screening mammogram for malignant neoplasm of breast: Secondary | ICD-10-CM

## 2023-11-01 DIAGNOSIS — E559 Vitamin D deficiency, unspecified: Secondary | ICD-10-CM

## 2023-11-01 DIAGNOSIS — Z114 Encounter for screening for human immunodeficiency virus [HIV]: Secondary | ICD-10-CM

## 2023-11-01 LAB — CBC WITH DIFFERENTIAL/PLATELET
Basophils Absolute: 0 10*3/uL (ref 0.0–0.1)
Basophils Relative: 0.4 % (ref 0.0–3.0)
Eosinophils Absolute: 0.1 10*3/uL (ref 0.0–0.7)
Eosinophils Relative: 2.4 % (ref 0.0–5.0)
HCT: 40.7 % (ref 36.0–46.0)
Hemoglobin: 13.2 g/dL (ref 12.0–15.0)
Lymphocytes Relative: 32.5 % (ref 12.0–46.0)
Lymphs Abs: 1.9 10*3/uL (ref 0.7–4.0)
MCHC: 32.4 g/dL (ref 30.0–36.0)
MCV: 83 fL (ref 78.0–100.0)
Monocytes Absolute: 0.4 10*3/uL (ref 0.1–1.0)
Monocytes Relative: 6.9 % (ref 3.0–12.0)
Neutro Abs: 3.4 10*3/uL (ref 1.4–7.7)
Neutrophils Relative %: 57.8 % (ref 43.0–77.0)
Platelets: 303 10*3/uL (ref 150.0–400.0)
RBC: 4.9 Mil/uL (ref 3.87–5.11)
RDW: 15.6 % — ABNORMAL HIGH (ref 11.5–15.5)
WBC: 6 10*3/uL (ref 4.0–10.5)

## 2023-11-01 LAB — COMPREHENSIVE METABOLIC PANEL
ALT: 17 U/L (ref 0–35)
AST: 16 U/L (ref 0–37)
Albumin: 4.1 g/dL (ref 3.5–5.2)
Alkaline Phosphatase: 59 U/L (ref 39–117)
BUN: 14 mg/dL (ref 6–23)
CO2: 26 meq/L (ref 19–32)
Calcium: 8.9 mg/dL (ref 8.4–10.5)
Chloride: 106 meq/L (ref 96–112)
Creatinine, Ser: 0.79 mg/dL (ref 0.40–1.20)
GFR: 86.47 mL/min (ref >60.00)
Glucose, Bld: 94 mg/dL (ref 70–99)
Potassium: 4.3 meq/L (ref 3.5–5.1)
Sodium: 138 meq/L (ref 135–145)
Total Bilirubin: 0.3 mg/dL (ref 0.2–1.2)
Total Protein: 7.1 g/dL (ref 6.0–8.3)

## 2023-11-01 LAB — LIPID PANEL
Cholesterol: 164 mg/dL (ref 0–200)
HDL: 44.8 mg/dL (ref >39.00)
LDL Cholesterol: 103 mg/dL — ABNORMAL HIGH (ref 0–99)
NonHDL: 119.14
Total CHOL/HDL Ratio: 4
Triglycerides: 81 mg/dL (ref 0.0–149.0)
VLDL: 16.2 mg/dL (ref 0.0–40.0)

## 2023-11-01 LAB — VITAMIN D 25 HYDROXY (VIT D DEFICIENCY, FRACTURES): VITD: 30.65 ng/mL (ref 30.00–100.00)

## 2023-11-01 LAB — VITAMIN B12: Vitamin B-12: 226 pg/mL (ref 211–911)

## 2023-11-01 LAB — TSH: TSH: 5.67 u[IU]/mL — ABNORMAL HIGH (ref 0.35–5.50)

## 2023-11-01 MED ORDER — MELOXICAM 7.5 MG PO TABS: 7.5000 mg | ORAL_TABLET | Freq: Every day | ORAL | 0 refills | Status: AC

## 2023-11-01 NOTE — Progress Notes (Signed)
Established Patient Office Visit     CC/Reason for Visit: Annual preventive exam, discuss low back pain  HPI: Natasha Wells is a 52 y.o. female who is coming in today for the above mentioned reasons. Past Medical History is significant for: Hypothyroidism and vitamin D deficiency followed by endocrinology.  She is overdue for an eye exam, has routine dental care.  All immunizations are up-to-date with the exception of COVID.  She is due for screening mammogram.  Her colonoscopy was in June 2021 and she was advised 5-year follow-up.  Cervical cancer screening is up-to-date.  She is again having another flareup of her left lower back pain with sciatica.  She has started doing back stretches.   Past Medical/Surgical History: Past Medical History:  Diagnosis Date   Allergic rhinitis    Allergy    Anemia    Birth control    condoms   Shingles    2023   Thyroid disease    Vitamin D deficiency     Past Surgical History:  Procedure Laterality Date   CESAREAN SECTION     x 2    Social History:  reports that she has never smoked. She has never been exposed to tobacco smoke. She has never used smokeless tobacco. She reports that she does not drink alcohol and does not use drugs.  Allergies: No Known Allergies  Family History:  Family History  Problem Relation Age of Onset   Lung cancer Brother    Cancer Brother        lung cancer   Leukemia Other        nephew   Cancer Other        nephew, leukemia    Hypertension Neg Hx    Stroke Neg Hx    Colon cancer Neg Hx    Esophageal cancer Neg Hx    Rectal cancer Neg Hx    Stomach cancer Neg Hx      Current Outpatient Medications:    levothyroxine (SYNTHROID) 75 MCG tablet, Take 75 mcg by mouth daily., Disp: , Rfl:    meloxicam (MOBIC) 7.5 MG tablet, Take 1 tablet (7.5 mg total) by mouth daily., Disp: 30 tablet, Rfl: 0   Vitamin D, Ergocalciferol, (DRISDOL) 1.25 MG (50000 UNIT) CAPS capsule, Take 50,000 Units by mouth  once a week., Disp: , Rfl:   Review of Systems:  Negative unless indicated in HPI.   Physical Exam: Vitals:   11/01/23 0707  BP: 120/80  Pulse: 64  Temp: 98.2 F (36.8 C)  TempSrc: Oral  SpO2: 96%  Weight: 193 lb 9.6 oz (87.8 kg)  Height: 5' 2.5" (1.588 m)    Body mass index is 34.85 kg/m.   Physical Exam Vitals reviewed.  Constitutional:      General: She is not in acute distress.    Appearance: Normal appearance. She is not ill-appearing, toxic-appearing or diaphoretic.  HENT:     Head: Normocephalic.     Right Ear: Tympanic membrane, ear canal and external ear normal. There is no impacted cerumen.     Left Ear: Tympanic membrane, ear canal and external ear normal. There is no impacted cerumen.     Nose: Nose normal.     Mouth/Throat:     Mouth: Mucous membranes are moist.     Pharynx: Oropharynx is clear. No oropharyngeal exudate or posterior oropharyngeal erythema.  Eyes:     General: No scleral icterus.       Right eye: No  discharge.        Left eye: No discharge.     Conjunctiva/sclera: Conjunctivae normal.     Pupils: Pupils are equal, round, and reactive to light.  Neck:     Vascular: No carotid bruit.  Cardiovascular:     Rate and Rhythm: Normal rate and regular rhythm.     Pulses: Normal pulses.     Heart sounds: Normal heart sounds.  Pulmonary:     Effort: Pulmonary effort is normal. No respiratory distress.     Breath sounds: Normal breath sounds.  Abdominal:     General: Abdomen is flat. Bowel sounds are normal.     Palpations: Abdomen is soft.  Musculoskeletal:        General: Normal range of motion.     Cervical back: Normal range of motion.  Skin:    General: Skin is warm and dry.  Neurological:     General: No focal deficit present.     Mental Status: She is alert and oriented to person, place, and time. Mental status is at baseline.  Psychiatric:        Mood and Affect: Mood normal.        Behavior: Behavior normal.        Thought  Content: Thought content normal.        Judgment: Judgment normal.     Flowsheet Row Office Visit from 11/01/2023 in [facility] [place]  PHQ-9 Total Score 2        Impression and Plan:  Encounter for preventive health examination  Vitamin D deficiency -     VITAMIN D 25 Hydroxy (Vit-D Deficiency, Fractures); Future  Hypothyroidism, unspecified type -     TSH; Future  Obesity (BMI 30.0-34.9) -     CBC with Differential/Platelet; Future -     Comprehensive metabolic panel; Future -     Lipid panel; Future -     Vitamin B12; Future  Chronic left-sided low back pain with left-sided sciatica -     Meloxicam; Take 1 tablet (7.5 mg total) by mouth daily.  Dispense: 30 tablet; Refill: 0  Encounter for hepatitis C screening test for low risk patient -     Hepatitis C antibody; Future  Encounter for screening for HIV -     HIV Antibody (routine testing w rflx); Future  Screening mammogram for breast cancer -     3D Screening Mammogram, Left and Right; Future   -Recommend routine eye and dental care. -Healthy lifestyle discussed in detail. -Labs to be updated today. -Prostate cancer screening: N/A Health Maintenance  Topic Date Due   HIV Screening  Never done   Hepatitis C Screening  Never done   COVID-19 Vaccine (2 - 2023-24 season) 08/15/2023   Mammogram  07/15/2024   Pap with HPV screening  05/08/2025   Colon Cancer Screening  06/07/2025   DTaP/Tdap/Td vaccine (3 - Td or Tdap) 03/01/2030   Flu Shot  Completed   Zoster (Shingles) Vaccine  Completed   HPV Vaccine  Aged Out     -All immunizations up-to-date with the exception of COVID which I have advised her to obtain at pharmacy. -Order placed for screening mammogram as she is now due.  Colon cancer screening is up-to-date. -She will schedule appointment with her ophthalmologist. -She is having another flareup of her left lower back pain with left-sided sciatica.  She has already started  doing back stretches.  I will send in some meloxicam as well.  Chaya Jan, MD [facility]

## 2023-11-03 LAB — HEPATITIS C ANTIBODY: Hepatitis C Ab: NONREACTIVE

## 2023-11-03 LAB — HIV ANTIBODY (ROUTINE TESTING W REFLEX): HIV 1&2 Ab, 4th Generation: NONREACTIVE

## 2023-11-07 ENCOUNTER — Encounter (HOSPITAL_COMMUNITY): Payer: Self-pay

## 2023-11-07 ENCOUNTER — Ambulatory Visit (HOSPITAL_COMMUNITY)
Admission: EM | Admit: 2023-11-07 | Discharge: 2023-11-07 | Disposition: A | Discharge status: Home / Self Care | Payer: PRIVATE HEALTH INSURANCE | Attending: Emergency Medicine | Admitting: Emergency Medicine

## 2023-11-07 DIAGNOSIS — M5442 Lumbago with sciatica, left side: Secondary | ICD-10-CM

## 2023-11-07 DIAGNOSIS — G8929 Other chronic pain: Secondary | ICD-10-CM

## 2023-11-07 LAB — POCT URINALYSIS DIP (MANUAL ENTRY)
Bilirubin, UA: NEGATIVE
Blood, UA: NEGATIVE
Glucose, UA: NEGATIVE mg/dL
Ketones, POC UA: NEGATIVE mg/dL
Leukocytes, UA: NEGATIVE
Nitrite, UA: NEGATIVE
Protein Ur, POC: NEGATIVE mg/dL
Spec Grav, UA: 1.015 (ref 1.010–1.025)
Urobilinogen, UA: 0.2 U/dL
pH, UA: 5.5 (ref 5.0–8.0)

## 2023-11-07 MED ORDER — METHYLPREDNISOLONE SODIUM SUCC 125 MG IJ SOLR
INTRAMUSCULAR | Status: AC
  Filled 2023-11-07: qty 2

## 2023-11-07 MED ORDER — KETOROLAC TROMETHAMINE 60 MG/2ML IM SOLN
30.0000 mg | Freq: Once | INTRAMUSCULAR | Status: AC
  Administered 2023-11-07: 30 mg via INTRAMUSCULAR

## 2023-11-07 MED ORDER — KETOROLAC TROMETHAMINE 30 MG/ML IJ SOLN
INTRAMUSCULAR | Status: AC
  Filled 2023-11-07: qty 1

## 2023-11-07 MED ORDER — METHYLPREDNISOLONE SODIUM SUCC 125 MG IJ SOLR
60.0000 mg | Freq: Once | INTRAMUSCULAR | Status: AC
  Administered 2023-11-07: 60 mg via INTRAMUSCULAR

## 2023-11-07 NOTE — ED Triage Notes (Signed)
Patient here today with c/o LB pain that is radiating down her left leg X 1 week. She has been taking Meloxicam with a little relief.

## 2023-11-07 NOTE — Discharge Instructions (Addendum)
Su orina no mostr ninguna infeccin. Le hemos administrado 2 inyecciones hoy en la clnica para Engineer, materials y la inflamacin. No tome Mobic hoy, ya que es la misma clase de medicamento que una inyeccin de Toradol; no puede tomar demasiados Transport planner. Puede tomar Mobic nuevamente maana. Contine haciendo los estiramientos y ejercicios de espalda. Tambin le sugiero que vuelva a hacer fisioterapia para ver si pueden ayudarlo con los brotes de su dolor de espalda crnico.  Regrese a la clnica si presenta sntomas nuevos o urgentes.  Your urine did not show any infection.  We have given you 2 injections today in clinic to help with your pain and inflammation.  Do not take the Mobic today as it is the same class of medication as a Toradol injection, you cannot take too many NSAIDs within a day.  You can take the Mobic again tomorrow.  Continue doing the stretches and back exercises, I also suggest following up with physical therapy again to see if they can assist you with your flareups of your chronic back pain.  Return to clinic for any new or urgent symptoms.

## 2023-11-07 NOTE — ED Provider Notes (Signed)
[facility]    CSN: [id number] Arrival date & time: 11/07/23  1010      History   Chief Complaint Chief Complaint  Patient presents with   Back Pain    HPI Natasha Wells is a 52 y.o. female.   Patient presents to clinic with her husband.  She declines language interpreter, reports she is able to speak English and her husband can translate with acute she cannot understand.  She presents to clinic with left-sided low back pain that has been radiating down the back of her left leg for the past week.  She was seen by her primary care provider on 11/18 where she was having the pain, but it is gotten much worse since then.  Denies any trauma, falls or new injuries.  She has been taking the daily meloxicam as prescribed.  She has not yet taken that today.  She is also concerned she may have a kidney infection.  She denies any dysuria but reports her urine has had an odor to it recently.  Some nausea, no vomiting or fever.  She does have a history of chronic left lower back pain with left-sided sciatica.     The history is provided by the patient and medical records. The history is limited by a language barrier.  Back Pain   Past Medical History:  Diagnosis Date   Allergic rhinitis    Allergy    Anemia    Birth control    condoms   Shingles    2023   Thyroid disease    Vitamin D deficiency     Patient Active Problem List   Diagnosis Date Noted   Vitamin D deficiency 01/08/2017   Amenorrhea, secondary 12/17/2014   Lumbago 12/17/2014   Bilateral carpal tunnel syndrome 08/24/2014   Hypothyroidism 08/22/2013   Hyperparathyroidism (HCC) 08/29/2012   Menstrual migraine 08/16/2012   Weight gain 08/16/2012   Hyperkalemia 05/22/2011   General medical examination 03/09/2011   Allergic rhinitis 03/22/2009   Leukocytopenia 02/29/2008   Backache 01/09/2008    Past Surgical History:  Procedure Laterality Date   CESAREAN SECTION     x 2    OB History      Gravida  2   Para  2   Term  2   Preterm      AB      Living  2      SAB      IAB      Ectopic      Multiple      Live Births  2            Home Medications    Prior to Admission medications   Medication Sig Start Date End Date Taking? Authorizing Provider  levothyroxine (SYNTHROID) 75 MCG tablet Take 75 mcg by mouth daily. 03/30/22   [provider]  meloxicam (MOBIC) 7.5 MG tablet Take 1 tablet (7.5 mg total) by mouth daily. 11/01/23   Philip Aspen, Limmie Patricia, MD  Vitamin D, Ergocalciferol, (DRISDOL) 1.25 MG (50000 UNIT) CAPS capsule Take 50,000 Units by mouth once a week. 04/24/22   [provider]    Family History Family History  Problem Relation Age of Onset   Lung cancer Brother    Cancer Brother        lung cancer   Leukemia Other        nephew   Cancer Other        nephew, leukemia  Hypertension Neg Hx    Stroke Neg Hx    Colon cancer Neg Hx    Esophageal cancer Neg Hx    Rectal cancer Neg Hx    Stomach cancer Neg Hx     Social History Social History   Tobacco Use   Smoking status: Never    Passive exposure: Never   Smokeless tobacco: Never  Vaping Use   Vaping status: Never Used  Substance Use Topics   Alcohol use: No    Alcohol/week: 0.0 standard drinks of alcohol   Drug use: No     Allergies   Patient has no known allergies.   Review of Systems Review of Systems  Per HPI   Physical Exam Triage Vital Signs ED Triage Vitals [11/07/23 1113]  Encounter Vitals Group     BP 132/74     Systolic BP Percentile      Diastolic BP Percentile      Pulse Rate 74     Resp 16     Temp 98.9 F (37.2 C)     Temp Source Oral     SpO2 95 %     Weight 190 lb (86.2 kg)     Height 5' (1.524 m)     Head Circumference      Peak Flow      Pain Score 10     Pain Loc      Pain Education      Exclude from Growth Chart    No data found.  Updated Vital Signs BP 132/74 (BP Location: Right Arm)   Pulse 74    Temp 98.9 F (37.2 C) (Oral)   Resp 16   Ht 5' (1.524 m)   Wt 190 lb (86.2 kg)   LMP 10/13/2023 (Approximate)   SpO2 95%   BMI 37.11 kg/m   Visual Acuity Right Eye Distance:   Left Eye Distance:   Bilateral Distance:    Right Eye Near:   Left Eye Near:    Bilateral Near:     Physical Exam Vitals and nursing note reviewed.  Constitutional:      Appearance: Normal appearance.  HENT:     Head: Normocephalic and atraumatic.     Right Ear: External ear normal.     Left Ear: External ear normal.     Nose: Nose normal.     Mouth/Throat:     Mouth: Mucous membranes are moist.  Eyes:     Conjunctiva/sclera: Conjunctivae normal.  Cardiovascular:     Rate and Rhythm: Normal rate and regular rhythm.     Heart sounds: Normal heart sounds. No murmur heard. Pulmonary:     Effort: Pulmonary effort is normal. No respiratory distress.     Breath sounds: Normal breath sounds.  Abdominal:     Tenderness: There is left CVA tenderness. There is no right CVA tenderness.  Musculoskeletal:        General: Tenderness present. No swelling. Normal range of motion.     Cervical back: Normal range of motion.     Lumbar back: Tenderness present.       Back:     Comments: Left sided lumbar TTP, pain radiates down back of left leg into left knee.   Skin:    General: Skin is warm and dry.  Neurological:     General: No focal deficit present.     Mental Status: She is alert.  Psychiatric:        Mood and Affect: Mood normal.  Behavior: Behavior is cooperative.      UC Treatments / Results  Labs (all labs ordered are listed, but only abnormal results are displayed) Labs Reviewed  POCT URINALYSIS DIP (MANUAL ENTRY)    EKG   Radiology No results found.  Procedures Procedures (including critical care time)  Medications Ordered in UC Medications  ketorolac (TORADOL) injection 30 mg (has no administration in time range)  methylPREDNISolone sodium succinate (SOLU-MEDROL) 125  mg/2 mL injection 60 mg (has no administration in time range)    Initial Impression / Assessment and Plan / UC Course  I have reviewed the triage vital signs and the nursing notes.  Pertinent labs & imaging results that were available during my care of the patient were reviewed by me and considered in my medical decision making (see chart for details).  Vitals and triage reviewed, patient is hemodynamically stable.  Reports left-sided CVA tenderness, this is also the site of her chronic left-sided low back pain with left-sided sciatica.  No new trauma or injuries, imaging deferred at this time.  Has been to her PCP and physical therapy for this issue.  Reports some urinary changes, UA unremarkable, low concern for UTI.  Will treat with Toradol and Solu-Medrol, as these worked well last time.  Without red flag symptoms of cauda equina, incontinence or inner leg numbness.  Work note provided.  Encouraged stretching and physical therapy for chronic pain.  Plan of care, follow-up care return precautions given, no questions at this time.     Final Clinical Impressions(s) / UC Diagnoses   Final diagnoses:  Chronic left-sided low back pain with left-sided sciatica     Discharge Instructions      Su orina no mostr ninguna infeccin. Le hemos administrado 2 inyecciones hoy en la clnica para Engineer, materials y la inflamacin. No tome Mobic hoy, ya que es la misma clase de medicamento que una inyeccin de Toradol; no puede tomar demasiados Transport planner. Puede tomar Mobic nuevamente maana. Contine haciendo los estiramientos y ejercicios de espalda. Tambin le sugiero que vuelva a hacer fisioterapia para ver si pueden ayudarlo con los brotes de su dolor de espalda crnico.  Regrese a la clnica si presenta sntomas nuevos o urgentes.  Your urine did not show any infection.  We have given you 2 injections today in clinic to help with your pain and inflammation.  Do not take the Mobic today as it is  the same class of medication as a Toradol injection, you cannot take too many NSAIDs within a day.  You can take the Mobic again tomorrow.  Continue doing the stretches and back exercises, I also suggest following up with physical therapy again to see if they can assist you with your flareups of your chronic back pain.  Return to clinic for any new or urgent symptoms.      ED Prescriptions   None    PDMP not reviewed this encounter.   Quitman Norberto, [place] N, [place] 11/07/23 1151

## 2023-11-09 ENCOUNTER — Other Ambulatory Visit: Payer: Self-pay | Admitting: Internal Medicine

## 2023-11-09 DIAGNOSIS — E039 Hypothyroidism, unspecified: Secondary | ICD-10-CM

## 2023-11-09 MED ORDER — LEVOTHYROXINE SODIUM 88 MCG PO TABS: 88.0000 ug | ORAL_TABLET | Freq: Every day | ORAL | 1 refills | Status: DC

## 2024-03-20 ENCOUNTER — Ambulatory Visit
Admission: RE | Admit: 2024-03-20 | Discharge: 2024-03-20 | Disposition: A | Discharge status: Home / Self Care | Payer: PRIVATE HEALTH INSURANCE | Source: Ambulatory Visit | Attending: Internal Medicine | Admitting: Internal Medicine

## 2024-03-20 DIAGNOSIS — Z1231 Encounter for screening mammogram for malignant neoplasm of breast: Secondary | ICD-10-CM

## 2024-05-29 ENCOUNTER — Other Ambulatory Visit: Payer: Self-pay | Admitting: Internal Medicine

## 2024-05-29 DIAGNOSIS — E039 Hypothyroidism, unspecified: Secondary | ICD-10-CM

## 2024-11-01 ENCOUNTER — Encounter: Payer: PRIVATE HEALTH INSURANCE | Admitting: Internal Medicine

## 2024-11-06 ENCOUNTER — Encounter: Payer: PRIVATE HEALTH INSURANCE | Admitting: Internal Medicine

## 2025-02-19 ENCOUNTER — Encounter: Payer: PRIVATE HEALTH INSURANCE | Admitting: Internal Medicine
